# Patient Record
Sex: Male | Born: 1945
Health system: Southern US, Community
[De-identification: ages and names within clinical notes are randomized; demographics above are authoritative.]

## PROBLEM LIST (undated history)

## (undated) DIAGNOSIS — I10 Essential (primary) hypertension: Secondary | ICD-10-CM

## (undated) DIAGNOSIS — K219 Gastro-esophageal reflux disease without esophagitis: Secondary | ICD-10-CM

## (undated) DIAGNOSIS — E7800 Pure hypercholesterolemia, unspecified: Secondary | ICD-10-CM

## (undated) DIAGNOSIS — R1310 Dysphagia, unspecified: Secondary | ICD-10-CM

## (undated) HISTORY — DX: Essential (primary) hypertension: I10

## (undated) HISTORY — DX: Dysphagia, unspecified: R13.10

## (undated) HISTORY — DX: Pure hypercholesterolemia, unspecified: E78.00

## (undated) HISTORY — PX: HERNIA REPAIR: SHX51

## (undated) HISTORY — PX: ESOPHAGOGASTRODUODENOSCOPY: SHX1529

---

## 2002-01-12 ENCOUNTER — Ambulatory Visit (HOSPITAL_COMMUNITY): Admission: RE | Admit: 2002-01-12 | Discharge: 2002-01-12 | Payer: Self-pay | Admitting: Family Medicine

## 2002-01-12 ENCOUNTER — Encounter: Payer: Self-pay | Admitting: Family Medicine

## 2002-05-20 ENCOUNTER — Emergency Department (HOSPITAL_COMMUNITY): Admission: EM | Admit: 2002-05-20 | Discharge: 2002-05-21 | Payer: Self-pay | Admitting: *Deleted

## 2002-05-21 ENCOUNTER — Encounter: Payer: Self-pay | Admitting: *Deleted

## 2010-09-06 DEATH — deceased

## 2011-05-07 DEATH — deceased

## 2011-08-09 DIAGNOSIS — E291 Testicular hypofunction: Secondary | ICD-10-CM | POA: Diagnosis not present

## 2011-08-29 DIAGNOSIS — Z6825 Body mass index (BMI) 25.0-25.9, adult: Secondary | ICD-10-CM | POA: Diagnosis not present

## 2011-08-29 DIAGNOSIS — I10 Essential (primary) hypertension: Secondary | ICD-10-CM | POA: Diagnosis not present

## 2011-08-29 DIAGNOSIS — E785 Hyperlipidemia, unspecified: Secondary | ICD-10-CM | POA: Diagnosis not present

## 2011-08-29 DIAGNOSIS — G4700 Insomnia, unspecified: Secondary | ICD-10-CM | POA: Diagnosis not present

## 2011-09-03 DIAGNOSIS — E785 Hyperlipidemia, unspecified: Secondary | ICD-10-CM | POA: Diagnosis not present

## 2011-09-03 DIAGNOSIS — G4700 Insomnia, unspecified: Secondary | ICD-10-CM | POA: Diagnosis not present

## 2011-09-03 DIAGNOSIS — Z125 Encounter for screening for malignant neoplasm of prostate: Secondary | ICD-10-CM | POA: Diagnosis not present

## 2011-09-03 DIAGNOSIS — I10 Essential (primary) hypertension: Secondary | ICD-10-CM | POA: Diagnosis not present

## 2011-09-14 DIAGNOSIS — E291 Testicular hypofunction: Secondary | ICD-10-CM | POA: Diagnosis not present

## 2011-10-11 DIAGNOSIS — E291 Testicular hypofunction: Secondary | ICD-10-CM | POA: Diagnosis not present

## 2011-11-09 DIAGNOSIS — E291 Testicular hypofunction: Secondary | ICD-10-CM | POA: Diagnosis not present

## 2011-12-07 DIAGNOSIS — E291 Testicular hypofunction: Secondary | ICD-10-CM | POA: Diagnosis not present

## 2012-01-08 DIAGNOSIS — E291 Testicular hypofunction: Secondary | ICD-10-CM | POA: Diagnosis not present

## 2012-01-22 DIAGNOSIS — Z6825 Body mass index (BMI) 25.0-25.9, adult: Secondary | ICD-10-CM | POA: Diagnosis not present

## 2012-01-22 DIAGNOSIS — I10 Essential (primary) hypertension: Secondary | ICD-10-CM | POA: Diagnosis not present

## 2012-01-22 DIAGNOSIS — L259 Unspecified contact dermatitis, unspecified cause: Secondary | ICD-10-CM | POA: Diagnosis not present

## 2012-01-26 DIAGNOSIS — L02419 Cutaneous abscess of limb, unspecified: Secondary | ICD-10-CM | POA: Diagnosis not present

## 2012-01-26 DIAGNOSIS — L03119 Cellulitis of unspecified part of limb: Secondary | ICD-10-CM | POA: Diagnosis not present

## 2012-02-15 DIAGNOSIS — E291 Testicular hypofunction: Secondary | ICD-10-CM | POA: Diagnosis not present

## 2012-03-01 DIAGNOSIS — J069 Acute upper respiratory infection, unspecified: Secondary | ICD-10-CM | POA: Diagnosis not present

## 2012-03-01 DIAGNOSIS — J019 Acute sinusitis, unspecified: Secondary | ICD-10-CM | POA: Diagnosis not present

## 2012-03-12 DIAGNOSIS — E291 Testicular hypofunction: Secondary | ICD-10-CM | POA: Diagnosis not present

## 2012-03-31 DIAGNOSIS — Z6825 Body mass index (BMI) 25.0-25.9, adult: Secondary | ICD-10-CM | POA: Diagnosis not present

## 2012-03-31 DIAGNOSIS — I10 Essential (primary) hypertension: Secondary | ICD-10-CM | POA: Diagnosis not present

## 2012-03-31 DIAGNOSIS — E785 Hyperlipidemia, unspecified: Secondary | ICD-10-CM | POA: Diagnosis not present

## 2012-04-01 DIAGNOSIS — I10 Essential (primary) hypertension: Secondary | ICD-10-CM | POA: Diagnosis not present

## 2012-04-01 DIAGNOSIS — E785 Hyperlipidemia, unspecified: Secondary | ICD-10-CM | POA: Diagnosis not present

## 2012-04-11 DIAGNOSIS — E291 Testicular hypofunction: Secondary | ICD-10-CM | POA: Diagnosis not present

## 2012-04-18 DIAGNOSIS — J069 Acute upper respiratory infection, unspecified: Secondary | ICD-10-CM | POA: Diagnosis not present

## 2012-04-18 DIAGNOSIS — Z6825 Body mass index (BMI) 25.0-25.9, adult: Secondary | ICD-10-CM | POA: Diagnosis not present

## 2012-04-18 DIAGNOSIS — J019 Acute sinusitis, unspecified: Secondary | ICD-10-CM | POA: Diagnosis not present

## 2012-04-29 DIAGNOSIS — IMO0002 Reserved for concepts with insufficient information to code with codable children: Secondary | ICD-10-CM | POA: Diagnosis not present

## 2012-04-29 DIAGNOSIS — J019 Acute sinusitis, unspecified: Secondary | ICD-10-CM | POA: Diagnosis not present

## 2012-05-09 DIAGNOSIS — Z23 Encounter for immunization: Secondary | ICD-10-CM | POA: Diagnosis not present

## 2012-05-09 DIAGNOSIS — E291 Testicular hypofunction: Secondary | ICD-10-CM | POA: Diagnosis not present

## 2012-06-10 DIAGNOSIS — E291 Testicular hypofunction: Secondary | ICD-10-CM | POA: Diagnosis not present

## 2012-07-09 DIAGNOSIS — E291 Testicular hypofunction: Secondary | ICD-10-CM | POA: Diagnosis not present

## 2012-08-13 DIAGNOSIS — E291 Testicular hypofunction: Secondary | ICD-10-CM | POA: Diagnosis not present

## 2012-08-25 DIAGNOSIS — Z6825 Body mass index (BMI) 25.0-25.9, adult: Secondary | ICD-10-CM | POA: Diagnosis not present

## 2012-08-25 DIAGNOSIS — E785 Hyperlipidemia, unspecified: Secondary | ICD-10-CM | POA: Diagnosis not present

## 2012-08-25 DIAGNOSIS — E291 Testicular hypofunction: Secondary | ICD-10-CM | POA: Diagnosis not present

## 2012-08-25 DIAGNOSIS — I10 Essential (primary) hypertension: Secondary | ICD-10-CM | POA: Diagnosis not present

## 2012-09-05 DIAGNOSIS — F411 Generalized anxiety disorder: Secondary | ICD-10-CM | POA: Diagnosis not present

## 2012-09-05 DIAGNOSIS — Z125 Encounter for screening for malignant neoplasm of prostate: Secondary | ICD-10-CM | POA: Diagnosis not present

## 2012-09-05 DIAGNOSIS — I10 Essential (primary) hypertension: Secondary | ICD-10-CM | POA: Diagnosis not present

## 2012-09-05 DIAGNOSIS — E785 Hyperlipidemia, unspecified: Secondary | ICD-10-CM | POA: Diagnosis not present

## 2012-09-05 DIAGNOSIS — E291 Testicular hypofunction: Secondary | ICD-10-CM | POA: Diagnosis not present

## 2012-09-08 DIAGNOSIS — D800 Hereditary hypogammaglobulinemia: Secondary | ICD-10-CM | POA: Diagnosis not present

## 2012-10-14 DIAGNOSIS — E291 Testicular hypofunction: Secondary | ICD-10-CM | POA: Diagnosis not present

## 2012-11-17 DIAGNOSIS — E785 Hyperlipidemia, unspecified: Secondary | ICD-10-CM | POA: Diagnosis not present

## 2012-11-17 DIAGNOSIS — Z6825 Body mass index (BMI) 25.0-25.9, adult: Secondary | ICD-10-CM | POA: Diagnosis not present

## 2012-11-17 DIAGNOSIS — I10 Essential (primary) hypertension: Secondary | ICD-10-CM | POA: Diagnosis not present

## 2012-11-17 DIAGNOSIS — Z79899 Other long term (current) drug therapy: Secondary | ICD-10-CM | POA: Diagnosis not present

## 2012-11-17 DIAGNOSIS — F411 Generalized anxiety disorder: Secondary | ICD-10-CM | POA: Diagnosis not present

## 2012-12-23 DIAGNOSIS — E785 Hyperlipidemia, unspecified: Secondary | ICD-10-CM | POA: Diagnosis not present

## 2012-12-23 DIAGNOSIS — Z79899 Other long term (current) drug therapy: Secondary | ICD-10-CM | POA: Diagnosis not present

## 2012-12-23 DIAGNOSIS — E291 Testicular hypofunction: Secondary | ICD-10-CM | POA: Diagnosis not present

## 2012-12-23 DIAGNOSIS — I10 Essential (primary) hypertension: Secondary | ICD-10-CM | POA: Diagnosis not present

## 2012-12-23 DIAGNOSIS — F411 Generalized anxiety disorder: Secondary | ICD-10-CM | POA: Diagnosis not present

## 2013-01-12 DIAGNOSIS — Z6825 Body mass index (BMI) 25.0-25.9, adult: Secondary | ICD-10-CM | POA: Diagnosis not present

## 2013-01-12 DIAGNOSIS — J019 Acute sinusitis, unspecified: Secondary | ICD-10-CM | POA: Diagnosis not present

## 2013-01-12 DIAGNOSIS — J309 Allergic rhinitis, unspecified: Secondary | ICD-10-CM | POA: Diagnosis not present

## 2013-01-20 DIAGNOSIS — E291 Testicular hypofunction: Secondary | ICD-10-CM | POA: Diagnosis not present

## 2013-01-30 DIAGNOSIS — R338 Other retention of urine: Secondary | ICD-10-CM | POA: Diagnosis not present

## 2013-01-30 DIAGNOSIS — R197 Diarrhea, unspecified: Secondary | ICD-10-CM | POA: Diagnosis not present

## 2013-01-30 DIAGNOSIS — R339 Retention of urine, unspecified: Secondary | ICD-10-CM | POA: Diagnosis not present

## 2013-01-30 DIAGNOSIS — R300 Dysuria: Secondary | ICD-10-CM | POA: Diagnosis not present

## 2013-02-02 DIAGNOSIS — R339 Retention of urine, unspecified: Secondary | ICD-10-CM | POA: Diagnosis not present

## 2013-02-03 DIAGNOSIS — R339 Retention of urine, unspecified: Secondary | ICD-10-CM | POA: Diagnosis not present

## 2013-02-09 DIAGNOSIS — R339 Retention of urine, unspecified: Secondary | ICD-10-CM | POA: Diagnosis not present

## 2013-02-09 DIAGNOSIS — N401 Enlarged prostate with lower urinary tract symptoms: Secondary | ICD-10-CM | POA: Diagnosis not present

## 2013-02-16 DIAGNOSIS — N401 Enlarged prostate with lower urinary tract symptoms: Secondary | ICD-10-CM | POA: Diagnosis not present

## 2013-03-09 DIAGNOSIS — E291 Testicular hypofunction: Secondary | ICD-10-CM | POA: Diagnosis not present

## 2013-04-15 DIAGNOSIS — E291 Testicular hypofunction: Secondary | ICD-10-CM | POA: Diagnosis not present

## 2013-05-08 DIAGNOSIS — Z23 Encounter for immunization: Secondary | ICD-10-CM | POA: Diagnosis not present

## 2013-05-08 DIAGNOSIS — E291 Testicular hypofunction: Secondary | ICD-10-CM | POA: Diagnosis not present

## 2013-05-18 DIAGNOSIS — N401 Enlarged prostate with lower urinary tract symptoms: Secondary | ICD-10-CM | POA: Diagnosis not present

## 2013-06-11 DIAGNOSIS — E291 Testicular hypofunction: Secondary | ICD-10-CM | POA: Diagnosis not present

## 2013-07-07 DIAGNOSIS — E291 Testicular hypofunction: Secondary | ICD-10-CM | POA: Diagnosis not present

## 2013-07-07 DIAGNOSIS — N400 Enlarged prostate without lower urinary tract symptoms: Secondary | ICD-10-CM | POA: Diagnosis not present

## 2013-07-07 DIAGNOSIS — IMO0002 Reserved for concepts with insufficient information to code with codable children: Secondary | ICD-10-CM | POA: Diagnosis not present

## 2013-07-07 DIAGNOSIS — F528 Other sexual dysfunction not due to a substance or known physiological condition: Secondary | ICD-10-CM | POA: Diagnosis not present

## 2013-07-21 DIAGNOSIS — I10 Essential (primary) hypertension: Secondary | ICD-10-CM | POA: Diagnosis not present

## 2013-08-25 DIAGNOSIS — Z6825 Body mass index (BMI) 25.0-25.9, adult: Secondary | ICD-10-CM | POA: Diagnosis not present

## 2013-08-25 DIAGNOSIS — F528 Other sexual dysfunction not due to a substance or known physiological condition: Secondary | ICD-10-CM | POA: Diagnosis not present

## 2013-08-25 DIAGNOSIS — I10 Essential (primary) hypertension: Secondary | ICD-10-CM | POA: Diagnosis not present

## 2013-08-25 DIAGNOSIS — J019 Acute sinusitis, unspecified: Secondary | ICD-10-CM | POA: Diagnosis not present

## 2013-08-25 DIAGNOSIS — N400 Enlarged prostate without lower urinary tract symptoms: Secondary | ICD-10-CM | POA: Diagnosis not present

## 2013-09-07 DIAGNOSIS — J019 Acute sinusitis, unspecified: Secondary | ICD-10-CM | POA: Diagnosis not present

## 2013-09-07 DIAGNOSIS — I10 Essential (primary) hypertension: Secondary | ICD-10-CM | POA: Diagnosis not present

## 2013-09-07 DIAGNOSIS — F528 Other sexual dysfunction not due to a substance or known physiological condition: Secondary | ICD-10-CM | POA: Diagnosis not present

## 2013-09-07 DIAGNOSIS — N400 Enlarged prostate without lower urinary tract symptoms: Secondary | ICD-10-CM | POA: Diagnosis not present

## 2013-09-28 DIAGNOSIS — IMO0002 Reserved for concepts with insufficient information to code with codable children: Secondary | ICD-10-CM | POA: Diagnosis not present

## 2013-09-28 DIAGNOSIS — I10 Essential (primary) hypertension: Secondary | ICD-10-CM | POA: Diagnosis not present

## 2013-09-28 DIAGNOSIS — J019 Acute sinusitis, unspecified: Secondary | ICD-10-CM | POA: Diagnosis not present

## 2013-10-03 DIAGNOSIS — IMO0002 Reserved for concepts with insufficient information to code with codable children: Secondary | ICD-10-CM | POA: Diagnosis not present

## 2013-10-03 DIAGNOSIS — R059 Cough, unspecified: Secondary | ICD-10-CM | POA: Diagnosis not present

## 2013-10-03 DIAGNOSIS — R05 Cough: Secondary | ICD-10-CM | POA: Diagnosis not present

## 2013-10-30 DIAGNOSIS — IMO0002 Reserved for concepts with insufficient information to code with codable children: Secondary | ICD-10-CM | POA: Diagnosis not present

## 2013-10-30 DIAGNOSIS — K1370 Unspecified lesions of oral mucosa: Secondary | ICD-10-CM | POA: Diagnosis not present

## 2014-02-22 DIAGNOSIS — N139 Obstructive and reflux uropathy, unspecified: Secondary | ICD-10-CM | POA: Diagnosis not present

## 2014-02-22 DIAGNOSIS — N401 Enlarged prostate with lower urinary tract symptoms: Secondary | ICD-10-CM | POA: Diagnosis not present

## 2014-05-07 DIAGNOSIS — Z23 Encounter for immunization: Secondary | ICD-10-CM | POA: Diagnosis not present

## 2014-08-20 DIAGNOSIS — Z23 Encounter for immunization: Secondary | ICD-10-CM | POA: Diagnosis not present

## 2014-08-26 DIAGNOSIS — E782 Mixed hyperlipidemia: Secondary | ICD-10-CM | POA: Diagnosis not present

## 2014-08-26 DIAGNOSIS — Z6825 Body mass index (BMI) 25.0-25.9, adult: Secondary | ICD-10-CM | POA: Diagnosis not present

## 2014-08-26 DIAGNOSIS — E663 Overweight: Secondary | ICD-10-CM | POA: Diagnosis not present

## 2014-08-26 DIAGNOSIS — R7301 Impaired fasting glucose: Secondary | ICD-10-CM | POA: Diagnosis not present

## 2014-08-26 DIAGNOSIS — I10 Essential (primary) hypertension: Secondary | ICD-10-CM | POA: Diagnosis not present

## 2014-08-26 DIAGNOSIS — Z125 Encounter for screening for malignant neoplasm of prostate: Secondary | ICD-10-CM | POA: Diagnosis not present

## 2014-10-15 DIAGNOSIS — R945 Abnormal results of liver function studies: Secondary | ICD-10-CM | POA: Diagnosis not present

## 2014-10-15 DIAGNOSIS — K7689 Other specified diseases of liver: Secondary | ICD-10-CM | POA: Diagnosis not present

## 2014-10-19 ENCOUNTER — Other Ambulatory Visit (HOSPITAL_COMMUNITY): Payer: Self-pay | Admitting: Family Medicine

## 2014-10-19 DIAGNOSIS — K7689 Other specified diseases of liver: Secondary | ICD-10-CM

## 2014-10-21 ENCOUNTER — Ambulatory Visit (HOSPITAL_COMMUNITY): Admission: RE | Admit: 2014-10-21 | Payer: BLUE CROSS/BLUE SHIELD | Source: Ambulatory Visit

## 2014-10-25 DIAGNOSIS — Z6825 Body mass index (BMI) 25.0-25.9, adult: Secondary | ICD-10-CM | POA: Diagnosis not present

## 2014-10-25 DIAGNOSIS — R945 Abnormal results of liver function studies: Secondary | ICD-10-CM | POA: Diagnosis not present

## 2014-10-27 ENCOUNTER — Ambulatory Visit (HOSPITAL_COMMUNITY): Payer: Medicare Other

## 2014-10-29 ENCOUNTER — Ambulatory Visit (HOSPITAL_COMMUNITY)
Admission: RE | Admit: 2014-10-29 | Discharge: 2014-10-29 | Disposition: A | Discharge status: Home / Self Care | Payer: Medicare Other | Source: Ambulatory Visit | Attending: Family Medicine | Admitting: Family Medicine

## 2014-10-29 DIAGNOSIS — K7689 Other specified diseases of liver: Secondary | ICD-10-CM | POA: Insufficient documentation

## 2014-10-29 DIAGNOSIS — R945 Abnormal results of liver function studies: Secondary | ICD-10-CM | POA: Diagnosis not present

## 2014-11-01 DIAGNOSIS — Z6825 Body mass index (BMI) 25.0-25.9, adult: Secondary | ICD-10-CM | POA: Diagnosis not present

## 2014-11-01 DIAGNOSIS — N401 Enlarged prostate with lower urinary tract symptoms: Secondary | ICD-10-CM | POA: Diagnosis not present

## 2014-11-01 DIAGNOSIS — J069 Acute upper respiratory infection, unspecified: Secondary | ICD-10-CM | POA: Diagnosis not present

## 2014-11-01 DIAGNOSIS — E663 Overweight: Secondary | ICD-10-CM | POA: Diagnosis not present

## 2014-11-16 DIAGNOSIS — R945 Abnormal results of liver function studies: Secondary | ICD-10-CM | POA: Diagnosis not present

## 2014-11-16 DIAGNOSIS — K838 Other specified diseases of biliary tract: Secondary | ICD-10-CM | POA: Diagnosis not present

## 2014-11-16 DIAGNOSIS — Z6823 Body mass index (BMI) 23.0-23.9, adult: Secondary | ICD-10-CM | POA: Diagnosis not present

## 2015-02-23 DIAGNOSIS — I10 Essential (primary) hypertension: Secondary | ICD-10-CM | POA: Diagnosis not present

## 2015-02-23 DIAGNOSIS — R972 Elevated prostate specific antigen [PSA]: Secondary | ICD-10-CM | POA: Diagnosis not present

## 2015-02-23 DIAGNOSIS — E782 Mixed hyperlipidemia: Secondary | ICD-10-CM | POA: Diagnosis not present

## 2015-02-23 DIAGNOSIS — Z1389 Encounter for screening for other disorder: Secondary | ICD-10-CM | POA: Diagnosis not present

## 2015-02-23 DIAGNOSIS — R7989 Other specified abnormal findings of blood chemistry: Secondary | ICD-10-CM | POA: Diagnosis not present

## 2015-02-23 DIAGNOSIS — R7309 Other abnormal glucose: Secondary | ICD-10-CM | POA: Diagnosis not present

## 2015-02-23 DIAGNOSIS — Z6823 Body mass index (BMI) 23.0-23.9, adult: Secondary | ICD-10-CM | POA: Diagnosis not present

## 2015-03-01 DIAGNOSIS — Z1389 Encounter for screening for other disorder: Secondary | ICD-10-CM | POA: Diagnosis not present

## 2015-03-01 DIAGNOSIS — Z6823 Body mass index (BMI) 23.0-23.9, adult: Secondary | ICD-10-CM | POA: Diagnosis not present

## 2015-03-01 DIAGNOSIS — J069 Acute upper respiratory infection, unspecified: Secondary | ICD-10-CM | POA: Diagnosis not present

## 2015-03-29 DIAGNOSIS — N138 Other obstructive and reflux uropathy: Secondary | ICD-10-CM | POA: Diagnosis not present

## 2015-03-29 DIAGNOSIS — N401 Enlarged prostate with lower urinary tract symptoms: Secondary | ICD-10-CM | POA: Diagnosis not present

## 2015-05-16 DIAGNOSIS — Z6822 Body mass index (BMI) 22.0-22.9, adult: Secondary | ICD-10-CM | POA: Diagnosis not present

## 2015-05-16 DIAGNOSIS — Z23 Encounter for immunization: Secondary | ICD-10-CM | POA: Diagnosis not present

## 2015-05-16 DIAGNOSIS — K838 Other specified diseases of biliary tract: Secondary | ICD-10-CM | POA: Diagnosis not present

## 2015-05-16 DIAGNOSIS — R945 Abnormal results of liver function studies: Secondary | ICD-10-CM | POA: Diagnosis not present

## 2015-05-16 DIAGNOSIS — I10 Essential (primary) hypertension: Secondary | ICD-10-CM | POA: Diagnosis not present

## 2015-05-16 DIAGNOSIS — E782 Mixed hyperlipidemia: Secondary | ICD-10-CM | POA: Diagnosis not present

## 2015-05-16 DIAGNOSIS — Z1389 Encounter for screening for other disorder: Secondary | ICD-10-CM | POA: Diagnosis not present

## 2015-05-16 DIAGNOSIS — R7309 Other abnormal glucose: Secondary | ICD-10-CM | POA: Diagnosis not present

## 2015-08-17 DIAGNOSIS — E782 Mixed hyperlipidemia: Secondary | ICD-10-CM | POA: Diagnosis not present

## 2015-08-17 DIAGNOSIS — I10 Essential (primary) hypertension: Secondary | ICD-10-CM | POA: Diagnosis not present

## 2015-08-17 DIAGNOSIS — Z23 Encounter for immunization: Secondary | ICD-10-CM | POA: Diagnosis not present

## 2015-08-17 DIAGNOSIS — Z125 Encounter for screening for malignant neoplasm of prostate: Secondary | ICD-10-CM | POA: Diagnosis not present

## 2015-08-17 DIAGNOSIS — Z1389 Encounter for screening for other disorder: Secondary | ICD-10-CM | POA: Diagnosis not present

## 2015-08-22 DIAGNOSIS — R7309 Other abnormal glucose: Secondary | ICD-10-CM | POA: Diagnosis not present

## 2015-09-06 DIAGNOSIS — I10 Essential (primary) hypertension: Secondary | ICD-10-CM | POA: Diagnosis not present

## 2015-09-06 DIAGNOSIS — Z6823 Body mass index (BMI) 23.0-23.9, adult: Secondary | ICD-10-CM | POA: Diagnosis not present

## 2015-09-06 DIAGNOSIS — Z1389 Encounter for screening for other disorder: Secondary | ICD-10-CM | POA: Diagnosis not present

## 2015-09-06 DIAGNOSIS — Z23 Encounter for immunization: Secondary | ICD-10-CM | POA: Diagnosis not present

## 2015-09-06 DIAGNOSIS — E782 Mixed hyperlipidemia: Secondary | ICD-10-CM | POA: Diagnosis not present

## 2015-09-06 DIAGNOSIS — R7309 Other abnormal glucose: Secondary | ICD-10-CM | POA: Diagnosis not present

## 2015-10-07 DIAGNOSIS — Z23 Encounter for immunization: Secondary | ICD-10-CM | POA: Diagnosis not present

## 2015-10-07 DIAGNOSIS — D485 Neoplasm of uncertain behavior of skin: Secondary | ICD-10-CM | POA: Diagnosis not present

## 2015-10-07 DIAGNOSIS — L821 Other seborrheic keratosis: Secondary | ICD-10-CM | POA: Diagnosis not present

## 2015-10-07 DIAGNOSIS — D045 Carcinoma in situ of skin of trunk: Secondary | ICD-10-CM | POA: Diagnosis not present

## 2015-10-07 DIAGNOSIS — L570 Actinic keratosis: Secondary | ICD-10-CM | POA: Diagnosis not present

## 2015-10-10 ENCOUNTER — Other Ambulatory Visit (HOSPITAL_COMMUNITY): Payer: Self-pay | Admitting: Surgery

## 2015-10-10 DIAGNOSIS — K828 Other specified diseases of gallbladder: Secondary | ICD-10-CM | POA: Diagnosis not present

## 2015-10-20 ENCOUNTER — Ambulatory Visit (HOSPITAL_COMMUNITY)
Admission: RE | Admit: 2015-10-20 | Discharge: 2015-10-20 | Disposition: A | Discharge status: Home / Self Care | Payer: Medicare Other | Source: Ambulatory Visit | Attending: Surgery | Admitting: Surgery

## 2015-10-20 DIAGNOSIS — K829 Disease of gallbladder, unspecified: Secondary | ICD-10-CM | POA: Diagnosis not present

## 2015-10-20 DIAGNOSIS — K828 Other specified diseases of gallbladder: Secondary | ICD-10-CM | POA: Diagnosis not present

## 2015-10-20 MED ORDER — TECHNETIUM TC 99M MEBROFENIN IV KIT
5.2000 | PACK | Freq: Once | INTRAVENOUS | Status: AC | PRN
  Administered 2015-10-20: 5 via INTRAVENOUS

## 2015-11-08 DIAGNOSIS — D045 Carcinoma in situ of skin of trunk: Secondary | ICD-10-CM | POA: Diagnosis not present

## 2015-11-30 DIAGNOSIS — Z6823 Body mass index (BMI) 23.0-23.9, adult: Secondary | ICD-10-CM | POA: Diagnosis not present

## 2015-11-30 DIAGNOSIS — Z0000 Encounter for general adult medical examination without abnormal findings: Secondary | ICD-10-CM | POA: Diagnosis not present

## 2015-11-30 DIAGNOSIS — Z1389 Encounter for screening for other disorder: Secondary | ICD-10-CM | POA: Diagnosis not present

## 2016-03-30 DIAGNOSIS — N400 Enlarged prostate without lower urinary tract symptoms: Secondary | ICD-10-CM | POA: Diagnosis not present

## 2016-04-27 IMAGING — NM NM HEPATO W/GB/PHARM/[PERSON_NAME]
2 series · 12 of 12 positions shown · non-contrast
Comparison: Abdominal ultrasound October 29, 2014

CLINICAL DATA: Ultrasound showing sludge in gallbladder

EXAM:
NUCLEAR MEDICINE HEPATOBILIARY IMAGING WITH GALLBLADDER EF
Views: Anterior right upper quadrant
RADIOPHARMACEUTICALS:  5.2 mCi Cc-YYm  Choletec IV

[Series 1: biliary · 4.14mm/px · 6 of 60 frames shown]
[frame 6/60]
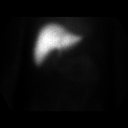
[frame 16/60]
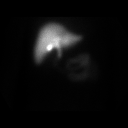
[frame 26/60]
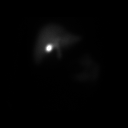
[frame 36/60]
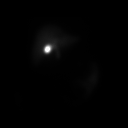
[frame 46/60]
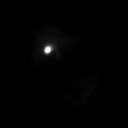
[frame 56/60]
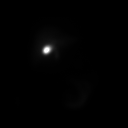

[Series 3: gbef · 4.14mm/px · 6 of 60 frames shown]
[frame 6/60]
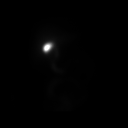
[frame 16/60]
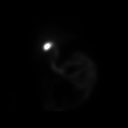
[frame 26/60]
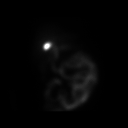
[frame 36/60]
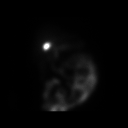
[frame 46/60]
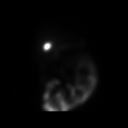
[frame 56/60]
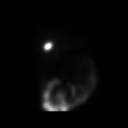

[12 of 12 positions shown; findings below may reference images not displayed]

FINDINGS: Liver uptake of radiotracer is normal. There is prompt visualization
of gallbladder and small bowel, indicating patency of the cystic and
common bile ducts. The patient consumed 8 ounces of Ensure orally
with calculation of the computer generated ejection fraction of
radiotracer from the gallbladder. The patient did not experience
clinical symptoms with the oral Ensure consumption. The computer
generated ejection fraction of radiotracer from the gallbladder is
normal at 74%, normal greater than 33% using the oral agent.
IMPRESSION: Study within normal limits.

## 2016-05-24 DIAGNOSIS — L570 Actinic keratosis: Secondary | ICD-10-CM | POA: Diagnosis not present

## 2016-05-24 DIAGNOSIS — Z85828 Personal history of other malignant neoplasm of skin: Secondary | ICD-10-CM | POA: Diagnosis not present

## 2016-05-24 DIAGNOSIS — L821 Other seborrheic keratosis: Secondary | ICD-10-CM | POA: Diagnosis not present

## 2016-05-24 DIAGNOSIS — Z23 Encounter for immunization: Secondary | ICD-10-CM | POA: Diagnosis not present

## 2016-06-01 DIAGNOSIS — I10 Essential (primary) hypertension: Secondary | ICD-10-CM | POA: Diagnosis not present

## 2016-06-01 DIAGNOSIS — Z6823 Body mass index (BMI) 23.0-23.9, adult: Secondary | ICD-10-CM | POA: Diagnosis not present

## 2016-06-01 DIAGNOSIS — R7309 Other abnormal glucose: Secondary | ICD-10-CM | POA: Diagnosis not present

## 2016-06-01 DIAGNOSIS — F419 Anxiety disorder, unspecified: Secondary | ICD-10-CM | POA: Diagnosis not present

## 2016-06-01 DIAGNOSIS — E782 Mixed hyperlipidemia: Secondary | ICD-10-CM | POA: Diagnosis not present

## 2016-06-01 DIAGNOSIS — R945 Abnormal results of liver function studies: Secondary | ICD-10-CM | POA: Diagnosis not present

## 2016-06-01 DIAGNOSIS — Z1389 Encounter for screening for other disorder: Secondary | ICD-10-CM | POA: Diagnosis not present

## 2016-07-20 DIAGNOSIS — B029 Zoster without complications: Secondary | ICD-10-CM | POA: Diagnosis not present

## 2016-07-20 DIAGNOSIS — Z6824 Body mass index (BMI) 24.0-24.9, adult: Secondary | ICD-10-CM | POA: Diagnosis not present

## 2016-08-30 ENCOUNTER — Encounter (INDEPENDENT_AMBULATORY_CARE_PROVIDER_SITE_OTHER): Payer: Self-pay | Admitting: Internal Medicine

## 2016-08-30 DIAGNOSIS — Z1389 Encounter for screening for other disorder: Secondary | ICD-10-CM | POA: Diagnosis not present

## 2016-08-30 DIAGNOSIS — K219 Gastro-esophageal reflux disease without esophagitis: Secondary | ICD-10-CM | POA: Diagnosis not present

## 2016-08-30 DIAGNOSIS — R1310 Dysphagia, unspecified: Secondary | ICD-10-CM | POA: Diagnosis not present

## 2016-08-30 DIAGNOSIS — Z6824 Body mass index (BMI) 24.0-24.9, adult: Secondary | ICD-10-CM | POA: Diagnosis not present

## 2016-09-03 ENCOUNTER — Encounter (INDEPENDENT_AMBULATORY_CARE_PROVIDER_SITE_OTHER): Payer: Self-pay | Admitting: *Deleted

## 2016-09-03 ENCOUNTER — Ambulatory Visit (INDEPENDENT_AMBULATORY_CARE_PROVIDER_SITE_OTHER): Payer: Medicare Other | Admitting: Internal Medicine

## 2016-09-03 ENCOUNTER — Encounter (INDEPENDENT_AMBULATORY_CARE_PROVIDER_SITE_OTHER): Payer: Self-pay | Admitting: Internal Medicine

## 2016-09-03 ENCOUNTER — Other Ambulatory Visit (INDEPENDENT_AMBULATORY_CARE_PROVIDER_SITE_OTHER): Payer: Self-pay | Admitting: Internal Medicine

## 2016-09-03 ENCOUNTER — Encounter (INDEPENDENT_AMBULATORY_CARE_PROVIDER_SITE_OTHER): Payer: Self-pay

## 2016-09-03 DIAGNOSIS — R1310 Dysphagia, unspecified: Secondary | ICD-10-CM | POA: Insufficient documentation

## 2016-09-03 DIAGNOSIS — R1319 Other dysphagia: Secondary | ICD-10-CM | POA: Insufficient documentation

## 2016-09-03 DIAGNOSIS — I10 Essential (primary) hypertension: Secondary | ICD-10-CM

## 2016-09-03 DIAGNOSIS — E7800 Pure hypercholesterolemia, unspecified: Secondary | ICD-10-CM | POA: Diagnosis not present

## 2016-09-03 NOTE — Progress Notes (Signed)
   Subjective:    Patient ID: Ethan Seat., male    DOB: 1945-10-09, 71 y.o.   MRN: [id number]  HPI Referred by Dr. Hilma Favors for dysphagia. Has been on Prilosec for about 3 weeks. Had been taking Tum for acid reflux.  Dysphagia for about a week. Last episode one week ago. Had to lean over a chair to dislodge the bolus (Bread). His appetite is good. No weight loss.  Usually has a BM daily. No melena or BRRB. Acid reflux controlled with Prilosec. Usually has a BM daily.     Past hx of dysphagia and has undergone EGD/ED in the past.  Review of Systems No past medical history on file.  No past surgical history on file.  No Known Allergies  No current outpatient prescriptions on file prior to visit.   No current facility-administered medications on file prior to visit.    Current Outpatient Prescriptions  Medication Sig Dispense Refill  . ALPRAZolam (XANAX) 1 MG tablet Take 1 mg by mouth at bedtime as needed for anxiety. 1/2 tab at night    . aspirin EC 81 MG tablet Take 81 mg by mouth daily.    . cholecalciferol (VITAMIN D) 400 units TABS tablet Take 2,000 Units by mouth.    . losartan-hydrochlorothiazide (HYZAAR) 50-12.5 MG tablet Take 1 tablet by mouth daily.    . Multiple Vitamins-Minerals (CENTRUM SILVER 50+MEN PO) Take by mouth.    . Omega-3 Fatty Acids (FISH OIL) 1200 MG CAPS Take by mouth.    Marland Kitchen omeprazole (PRILOSEC) 20 MG capsule Take 20 mg by mouth daily.    . Saw Palmetto 450 MG CAPS Take by mouth 2 (two) times daily.    . simvastatin (ZOCOR) 10 MG tablet Take 10 mg by mouth daily.    . tadalafil (CIALIS) 5 MG tablet Take 5 mg by mouth daily as needed for erectile dysfunction. 1/2 tab every other night    . tamsulosin (FLOMAX) 0.4 MG CAPS capsule Take 0.4 mg by mouth.     No current facility-administered medications for this visit.        Objective:   Physical Exam Blood pressure 140/70, pulse 64, temperature 97.5 F (36.4 C), height 5\' 10"  (1.778 m), weight 171  lb 6.4 oz (77.7 kg).  Alert and oriented. Skin warm and dry. Oral mucosa is moist.   . Sclera anicteric, conjunctivae is pink. Thyroid not enlarged. No cervical lymphadenopathy. Lungs clear. Heart regular rate and rhythm.  Abdomen is soft. Bowel sounds are positive. No hepatomegaly. No abdominal masses felt. No tenderness.  No edema to lower extremities. Patient is alert and oriented.      Assessment & Plan:  Dysphagia. EGD/ED. Stricture, Web needs to be ruled out.

## 2016-09-03 NOTE — Patient Instructions (Signed)
EGD/ED. The risks and benefits such as perforation, bleeding, and infection were reviewed with the patient and is agreeable. 

## 2016-09-07 ENCOUNTER — Encounter (HOSPITAL_COMMUNITY)
Admission: RE | Disposition: A | Discharge status: Home / Self Care | Payer: Self-pay | Source: Ambulatory Visit | Attending: Internal Medicine

## 2016-09-07 ENCOUNTER — Encounter (HOSPITAL_COMMUNITY): Payer: Self-pay | Admitting: *Deleted

## 2016-09-07 ENCOUNTER — Ambulatory Visit (HOSPITAL_COMMUNITY)
Admission: RE | Admit: 2016-09-07 | Discharge: 2016-09-07 | Disposition: A | Discharge status: Home / Self Care | Payer: Medicare Other | Source: Ambulatory Visit | Attending: Internal Medicine | Admitting: Internal Medicine

## 2016-09-07 DIAGNOSIS — E7800 Pure hypercholesterolemia, unspecified: Secondary | ICD-10-CM | POA: Diagnosis not present

## 2016-09-07 DIAGNOSIS — K222 Esophageal obstruction: Secondary | ICD-10-CM | POA: Diagnosis not present

## 2016-09-07 DIAGNOSIS — K219 Gastro-esophageal reflux disease without esophagitis: Secondary | ICD-10-CM | POA: Diagnosis not present

## 2016-09-07 DIAGNOSIS — K449 Diaphragmatic hernia without obstruction or gangrene: Secondary | ICD-10-CM | POA: Insufficient documentation

## 2016-09-07 DIAGNOSIS — Z7982 Long term (current) use of aspirin: Secondary | ICD-10-CM | POA: Diagnosis not present

## 2016-09-07 DIAGNOSIS — I10 Essential (primary) hypertension: Secondary | ICD-10-CM | POA: Diagnosis not present

## 2016-09-07 DIAGNOSIS — R1310 Dysphagia, unspecified: Secondary | ICD-10-CM

## 2016-09-07 DIAGNOSIS — R1319 Other dysphagia: Secondary | ICD-10-CM | POA: Insufficient documentation

## 2016-09-07 DIAGNOSIS — K2950 Unspecified chronic gastritis without bleeding: Secondary | ICD-10-CM | POA: Diagnosis not present

## 2016-09-07 DIAGNOSIS — Z79899 Other long term (current) drug therapy: Secondary | ICD-10-CM | POA: Insufficient documentation

## 2016-09-07 DIAGNOSIS — R1314 Dysphagia, pharyngoesophageal phase: Secondary | ICD-10-CM | POA: Diagnosis not present

## 2016-09-07 DIAGNOSIS — K2970 Gastritis, unspecified, without bleeding: Secondary | ICD-10-CM | POA: Diagnosis not present

## 2016-09-07 HISTORY — PX: ESOPHAGEAL DILATION: SHX303

## 2016-09-07 HISTORY — PX: ESOPHAGOGASTRODUODENOSCOPY: SHX5428

## 2016-09-07 HISTORY — DX: Gastro-esophageal reflux disease without esophagitis: K21.9

## 2016-09-07 SURGERY — EGD (ESOPHAGOGASTRODUODENOSCOPY)
Anesthesia: Moderate Sedation

## 2016-09-07 MED ORDER — MIDAZOLAM HCL 5 MG/5ML IJ SOLN
INTRAMUSCULAR | Status: AC
  Filled 2016-09-07: qty 10

## 2016-09-07 MED ORDER — MIDAZOLAM HCL 5 MG/5ML IJ SOLN
INTRAMUSCULAR | Status: DC | PRN
  Administered 2016-09-07 (×2): 1 mg via INTRAVENOUS
  Administered 2016-09-07 (×2): 2 mg via INTRAVENOUS
  Administered 2016-09-07: 3 mg via INTRAVENOUS

## 2016-09-07 MED ORDER — BUTAMBEN-TETRACAINE-BENZOCAINE 2-2-14 % EX AERO
INHALATION_SPRAY | CUTANEOUS | Status: DC | PRN
  Administered 2016-09-07: 2 via TOPICAL

## 2016-09-07 MED ORDER — OMEPRAZOLE 20 MG PO CPDR: 20.0000 mg | DELAYED_RELEASE_CAPSULE | Freq: Every day | ORAL | Status: DC

## 2016-09-07 MED ORDER — SODIUM CHLORIDE 0.9 % IV SOLN
INTRAVENOUS | Status: DC
  Administered 2016-09-07: 10:00:00 via INTRAVENOUS

## 2016-09-07 MED ORDER — MEPERIDINE HCL 50 MG/ML IJ SOLN
INTRAMUSCULAR | Status: DC | PRN
  Administered 2016-09-07 (×2): 25 mg via INTRAVENOUS

## 2016-09-07 MED ORDER — MEPERIDINE HCL 50 MG/ML IJ SOLN
INTRAMUSCULAR | Status: AC
  Filled 2016-09-07: qty 1

## 2016-09-07 MED ORDER — SIMETHICONE 40 MG/0.6ML PO SUSP
ORAL | Status: DC | PRN
  Administered 2016-09-07: 2.5 mL

## 2016-09-07 NOTE — H&P (Signed)
Ethan Kubacki. is an 71 y.o. male.   Chief Complaint: Patient is here for EGD and ED. HPI: Patient is 71 year old Caucasian male was chronic GERD and now presents with few week history of solid food dysphagia. He had one episode of food impaction that he had to regurgitate in order to get relief. Other times food bolus passes down. He has no difficulty with liquids. He decided to come off the Prilosec because of concern for side effects and had been using OTC medications. Dr. Hilma Favors advised to go back to Prilosec he is taking it 3-4 times a week. Last EGD and dilation was 2001.  Past Medical History:  Diagnosis Date   Hiatal hernia   . GERD (gastroesophageal reflux disease)   . High cholesterol   . Hypertension     Past Surgical History:  Procedure Laterality Date  . ESOPHAGOGASTRODUODENOSCOPY     2001 or 2002 for dysphagia  . HERNIA REPAIR      History reviewed. No pertinent family history. Social History:  reports that he has never smoked. He has never used smokeless tobacco. He reports that he does not drink alcohol or use drugs.  Allergies: No Known Allergies  Medications Prior to Admission  Medication Sig Dispense Refill  . ALPRAZolam (XANAX) 1 MG tablet Take 0.5 mg by mouth at bedtime as needed for sleep. 1/2 tab at night    . aspirin EC 81 MG tablet Take 81 mg by mouth daily.    . Ergocalciferol (VITAMIN D2) 2000 units TABS Take 2,000 mg by mouth daily.    Marland Kitchen losartan-hydrochlorothiazide (HYZAAR) 50-12.5 MG tablet Take 1 tablet by mouth daily.    . Multiple Vitamins-Minerals (CENTRUM SILVER 50+MEN PO) Take 1 tablet by mouth daily.     . Omega-3 Fatty Acids (FISH OIL) 1200 MG CAPS Take 1,200 mg by mouth daily.     Marland Kitchen omeprazole (PRILOSEC) 20 MG capsule Take 20 mg by mouth daily as needed.     . Saw Palmetto 450 MG CAPS Take 450 mg by mouth daily.     . simvastatin (ZOCOR) 10 MG tablet Take 10 mg by mouth daily.    . tamsulosin (FLOMAX) 0.4 MG CAPS capsule Take 0.4 mg by  mouth daily after supper.     . tadalafil (CIALIS) 5 MG tablet Take 5 mg by mouth daily as needed for erectile dysfunction.       No results found for this or any previous visit (from the past 48 hour(s)). No results found.  ROS  Blood pressure (!) 156/81, pulse 85, temperature 97.6 F (36.4 C), temperature source Oral, resp. rate 12, SpO2 99 %. Physical Exam  Constitutional: He appears well-developed and well-nourished.  HENT:  Mouth/Throat: Oropharynx is clear and moist.  Eyes: Conjunctivae are normal. No scleral icterus.  Neck: No thyromegaly present.  Cardiovascular: Normal rate, regular rhythm and normal heart sounds.   No murmur heard. Respiratory: Effort normal and breath sounds normal.  GI: Soft. He exhibits no distension and no mass. There is no tenderness.  Musculoskeletal: He exhibits no edema.  Lymphadenopathy:    He has no cervical adenopathy.  Neurological: He is alert.  Skin: Skin is warm and dry.     Assessment/Plan Solid food dysphagia. Chronic GERD. EGD with ED.  Hildred Laser, MD 09/07/2016, 11:26 AM

## 2016-09-07 NOTE — Op Note (Signed)
[facility] Patient Name: Ethan Gardner Procedure Date: 09/07/2016 11:03 AM MRN: [id number] Date of Birth: 07/22/1946 Attending MD: Hildred Laser , MD CSN: [id number] Age: 71 Admit Type: Outpatient Procedure:                Upper GI endoscopy Indications:              Esophageal dysphagia, Gastro-esophageal reflux                            disease Providers:                Hildred Laser, MD, Lurline Del, RN, Purcell Nails. Cedar,                            Technician Referring MD:             Halford Chessman, MD Medicines:                Cetacaine spray, Meperidine 50 mg IV, Midazolam 9                            mg IV Complications:            No immediate complications. Estimated Blood Loss:     Estimated blood loss was minimal. Procedure:                Pre-Anesthesia Assessment:                           - Prior to the procedure, a History and Physical                            was performed, and patient medications and                            allergies were reviewed. The patient's tolerance of                            previous anesthesia was also reviewed. The risks                            and benefits of the procedure and the sedation                            options and risks were discussed with the patient.                            All questions were answered, and informed consent                            was obtained. Prior Anticoagulants: The patient                            last took aspirin 3 days prior to the procedure.  ASA Grade Assessment: II - A patient with mild                            systemic disease. After reviewing the risks and                            benefits, the patient was deemed in satisfactory                            condition to undergo the procedure.                           After obtaining informed consent, the endoscope was                            passed under direct vision. Throughout the                   procedure, the patient's blood pressure, pulse, and                            oxygen saturations were monitored continuously. The                            [id number]) scope was introduced through the                            mouth, and advanced to the second part of duodenum.                            The upper GI endoscopy was accomplished without                            difficulty. The patient tolerated the procedure                            well. Scope In: 11:39:48 AM Scope Out: 11:50:48 AM Total Procedure Duration: 0 hours 11 minutes 0 seconds  Findings:      The examined esophagus was normal.      The Z-line was regular and was found 39 cm from the incisors with focal       erythema.      A low-grade of narrowing Schatzki ring (acquired) was found at the       gastroesophageal junction. The scope was withdrawn. Dilation was       performed with a Maloney dilator with no resistance at 34 Fr. The       dilation site was examined following endoscope reinsertion and showed       complete resolution of luminal narrowing.      A 2 cm hiatal hernia was present.      Diffuse mild inflammation characterized by congestion (edema), erythema       and granularity was found in the gastric antrum. Biopsies were taken       with a cold forceps for histology.      The exam of the stomach was otherwise normal.      The  duodenal bulb and second portion of the duodenum were normal. Impression:               - Normal esophagus.                           - Z-line regular, 39 cm from the incisors with                            focal changes of esophagitis.                           - Low-grade of narrowing Schatzki ring. Dilated.                           - 2 cm hiatal hernia.                           - Gastritis. Biopsied.                           - Normal duodenal bulb and second portion of the                            duodenum. Moderate Sedation:       Moderate (conscious) sedation was administered by the endoscopy nurse       and supervised by the endoscopist. The following parameters were       monitored: oxygen saturation, heart rate, blood pressure, CO2       capnography and response to care. Total physician intraservice time was       17 minutes. Recommendation:           - Patient has a contact number available for                            emergencies. The signs and symptoms of potential                            delayed complications were discussed with the                            patient. Return to normal activities tomorrow.                            Written discharge instructions were provided to the                            patient.                           - Resume previous diet today.                           - Continue present medications.                           - No aspirin, ibuprofen, naproxen, or other  non-steroidal anti-inflammatory drugs for 1 day.                           - Await pathology results. Procedure Code(s):        --- Professional ---                           [phone], Esophagogastroduodenoscopy, flexible,                            transoral; with biopsy, single or multiple                           43450, Dilation of esophagus, by unguided sound or                            bougie, single or multiple passes                           99152, Moderate sedation services provided by the                            same physician or other qualified health care                            professional performing the diagnostic or                            therapeutic service that the sedation supports,                            requiring the presence of an independent trained                            observer to assist in the monitoring of the                            patient's level of consciousness and physiological                            status; initial 15  minutes of intraservice time,                            patient age 21 years or older Diagnosis Code(s):        --- Professional ---                           K22.2, Esophageal obstruction                           K44.9, Diaphragmatic hernia without obstruction or                            gangrene  K29.70, Gastritis, unspecified, without bleeding                           R13.14, Dysphagia, pharyngoesophageal phase                           K21.9, Gastro-esophageal reflux disease without                            esophagitis CPT copyright 2016 American Medical Association. All rights reserved. The codes documented in this report are preliminary and upon coder review may  be revised to meet current compliance requirements. Hildred Laser, MD Hildred Laser, MD 09/07/2016 12:02:42 PM This report has been signed electronically. Number of Addenda: 0

## 2016-09-07 NOTE — Discharge Instructions (Signed)
Take omeprazole 20 mg by mouth 30 minutes before breakfast daily for at least 12 weeks and thereafter every other day. Resume aspirin on 09/08/2016. Resume usual diet. No driving for 24 hours. Physician will call with biopsy results.   Esophagogastroduodenoscopy, Care After Introduction Refer to this sheet in the next few weeks. These instructions provide you with information about caring for yourself after your procedure. Your health care provider may also give you more specific instructions. Your treatment has been planned according to current medical practices, but problems sometimes occur. Call your health care provider if you have any problems or questions after your procedure. What can I expect after the procedure? After the procedure, it is common to have:  A sore throat.  Nausea.  Bloating.  Dizziness.  Fatigue. Follow these instructions at home:  Do not eat or drink anything until the numbing medicine (local anesthetic) has worn off and your gag reflex has returned. You will know that the local anesthetic has worn off when you can swallow comfortably.  Do not drive for 24 hours if you received a medicine to help you relax (sedative).  If your health care provider took a tissue sample for testing during the procedure, make sure to get your test results. This is your responsibility. Ask your health care provider or the department performing the test when your results will be ready.  Keep all follow-up visits as told by your health care provider. This is important. Contact a health care provider if:  You cannot stop coughing.  You are not urinating.  You are urinating less than usual. Get help right away if:  You have trouble swallowing.  You cannot eat or drink.  You have throat or chest pain that gets worse.  You are dizzy or light-headed.  You faint.  You have nausea or vomiting.  You have chills.  You have a fever.  You have severe abdominal  pain.  You have black, tarry, or bloody stools. This information is not intended to replace advice given to you by your health care provider. Make sure you discuss any questions you have with your health care provider. Document Released: 07/09/2012 Document Revised: 12/29/2015 Document Reviewed: 06/16/2015  2017 Elsevier

## 2016-09-10 ENCOUNTER — Ambulatory Visit (INDEPENDENT_AMBULATORY_CARE_PROVIDER_SITE_OTHER): Payer: Medicare Other | Admitting: Internal Medicine

## 2016-10-01 ENCOUNTER — Encounter (HOSPITAL_COMMUNITY): Payer: Self-pay | Admitting: Internal Medicine

## 2016-10-02 ENCOUNTER — Encounter (INDEPENDENT_AMBULATORY_CARE_PROVIDER_SITE_OTHER): Payer: Self-pay

## 2016-11-27 DIAGNOSIS — L570 Actinic keratosis: Secondary | ICD-10-CM | POA: Diagnosis not present

## 2016-11-27 DIAGNOSIS — Z85828 Personal history of other malignant neoplasm of skin: Secondary | ICD-10-CM | POA: Diagnosis not present

## 2016-11-27 DIAGNOSIS — L821 Other seborrheic keratosis: Secondary | ICD-10-CM | POA: Diagnosis not present

## 2017-01-16 DIAGNOSIS — R945 Abnormal results of liver function studies: Secondary | ICD-10-CM | POA: Diagnosis not present

## 2017-01-16 DIAGNOSIS — Z0001 Encounter for general adult medical examination with abnormal findings: Secondary | ICD-10-CM | POA: Diagnosis not present

## 2017-01-16 DIAGNOSIS — R7309 Other abnormal glucose: Secondary | ICD-10-CM | POA: Diagnosis not present

## 2017-01-16 DIAGNOSIS — Z6825 Body mass index (BMI) 25.0-25.9, adult: Secondary | ICD-10-CM | POA: Diagnosis not present

## 2017-01-16 DIAGNOSIS — I10 Essential (primary) hypertension: Secondary | ICD-10-CM | POA: Diagnosis not present

## 2017-01-16 DIAGNOSIS — R972 Elevated prostate specific antigen [PSA]: Secondary | ICD-10-CM | POA: Diagnosis not present

## 2017-01-16 DIAGNOSIS — E663 Overweight: Secondary | ICD-10-CM | POA: Diagnosis not present

## 2017-01-16 DIAGNOSIS — M1000 Idiopathic gout, unspecified site: Secondary | ICD-10-CM | POA: Diagnosis not present

## 2017-01-16 DIAGNOSIS — Z1389 Encounter for screening for other disorder: Secondary | ICD-10-CM | POA: Diagnosis not present

## 2017-01-22 DIAGNOSIS — Z1211 Encounter for screening for malignant neoplasm of colon: Secondary | ICD-10-CM | POA: Diagnosis not present

## 2017-03-08 ENCOUNTER — Other Ambulatory Visit (INDEPENDENT_AMBULATORY_CARE_PROVIDER_SITE_OTHER): Payer: Self-pay | Admitting: Internal Medicine

## 2017-05-20 DIAGNOSIS — Z23 Encounter for immunization: Secondary | ICD-10-CM | POA: Diagnosis not present

## 2017-09-02 DIAGNOSIS — Z1389 Encounter for screening for other disorder: Secondary | ICD-10-CM | POA: Diagnosis not present

## 2017-09-02 DIAGNOSIS — E663 Overweight: Secondary | ICD-10-CM | POA: Diagnosis not present

## 2017-09-02 DIAGNOSIS — Z6826 Body mass index (BMI) 26.0-26.9, adult: Secondary | ICD-10-CM | POA: Diagnosis not present

## 2017-09-02 DIAGNOSIS — H6692 Otitis media, unspecified, left ear: Secondary | ICD-10-CM | POA: Diagnosis not present

## 2017-09-16 ENCOUNTER — Other Ambulatory Visit (INDEPENDENT_AMBULATORY_CARE_PROVIDER_SITE_OTHER): Payer: Self-pay | Admitting: Internal Medicine

## 2017-09-16 NOTE — Telephone Encounter (Signed)
Patient will need office prior to further refills , he has a 3 months supply. Or he may get from his PCP.

## 2017-09-16 NOTE — Telephone Encounter (Signed)
I called patient and left a voicemail

## 2017-09-17 DIAGNOSIS — G4709 Other insomnia: Secondary | ICD-10-CM | POA: Diagnosis not present

## 2017-09-17 DIAGNOSIS — F419 Anxiety disorder, unspecified: Secondary | ICD-10-CM | POA: Diagnosis not present

## 2017-09-17 DIAGNOSIS — E663 Overweight: Secondary | ICD-10-CM | POA: Diagnosis not present

## 2017-09-17 DIAGNOSIS — Z6825 Body mass index (BMI) 25.0-25.9, adult: Secondary | ICD-10-CM | POA: Diagnosis not present

## 2017-10-07 ENCOUNTER — Emergency Department (HOSPITAL_COMMUNITY)
Admission: EM | Admit: 2017-10-07 | Discharge: 2017-10-07 | Disposition: A | Discharge status: Home / Self Care | Payer: Medicare Other | Attending: Emergency Medicine | Admitting: Emergency Medicine

## 2017-10-07 ENCOUNTER — Encounter (HOSPITAL_COMMUNITY): Payer: Self-pay

## 2017-10-07 DIAGNOSIS — R197 Diarrhea, unspecified: Secondary | ICD-10-CM | POA: Diagnosis not present

## 2017-10-07 DIAGNOSIS — I10 Essential (primary) hypertension: Secondary | ICD-10-CM | POA: Diagnosis not present

## 2017-10-07 DIAGNOSIS — Z79899 Other long term (current) drug therapy: Secondary | ICD-10-CM | POA: Diagnosis not present

## 2017-10-07 DIAGNOSIS — R109 Unspecified abdominal pain: Secondary | ICD-10-CM | POA: Diagnosis not present

## 2017-10-07 DIAGNOSIS — Z7982 Long term (current) use of aspirin: Secondary | ICD-10-CM | POA: Diagnosis not present

## 2017-10-07 LAB — URINALYSIS, ROUTINE W REFLEX MICROSCOPIC
Bilirubin Urine: NEGATIVE
Glucose, UA: NEGATIVE mg/dL
Hgb urine dipstick: NEGATIVE
Ketones, ur: 5 mg/dL — AB
Leukocytes, UA: NEGATIVE
Nitrite: NEGATIVE
Protein, ur: NEGATIVE mg/dL
Specific Gravity, Urine: 1.026 (ref 1.005–1.030)
pH: 5 (ref 5.0–8.0)

## 2017-10-07 LAB — COMPREHENSIVE METABOLIC PANEL
ALT: 29 U/L (ref 17–63)
AST: 27 U/L (ref 15–41)
Albumin: 4.2 g/dL (ref 3.5–5.0)
Alkaline Phosphatase: 89 U/L (ref 38–126)
Anion gap: 14 (ref 5–15)
BUN: 19 mg/dL (ref 6–20)
CO2: 22 mmol/L (ref 22–32)
Calcium: 9.3 mg/dL (ref 8.9–10.3)
Chloride: 102 mmol/L (ref 101–111)
Creatinine, Ser: 1.06 mg/dL (ref 0.61–1.24)
GFR calc Af Amer: >60 mL/min (ref >60)
GFR calc non Af Amer: >60 mL/min (ref >60)
Glucose, Bld: 117 mg/dL — ABNORMAL HIGH (ref 65–99)
Potassium: 3.9 mmol/L (ref 3.5–5.1)
Sodium: 138 mmol/L (ref 135–145)
Total Bilirubin: 1.4 mg/dL — ABNORMAL HIGH (ref 0.3–1.2)
Total Protein: 7.3 g/dL (ref 6.5–8.1)

## 2017-10-07 LAB — CBC
HCT: 53.8 % — ABNORMAL HIGH (ref 39.0–52.0)
Hemoglobin: 17.8 g/dL — ABNORMAL HIGH (ref 13.0–17.0)
MCH: 30.2 pg (ref 26.0–34.0)
MCHC: 33.1 g/dL (ref 30.0–36.0)
MCV: 91.3 fL (ref 78.0–100.0)
Platelets: 251 10*3/uL (ref 150–400)
RBC: 5.89 MIL/uL — ABNORMAL HIGH (ref 4.22–5.81)
RDW: 14.9 % (ref 11.5–15.5)
WBC: 18.1 10*3/uL — ABNORMAL HIGH (ref 4.0–10.5)

## 2017-10-07 LAB — C DIFFICILE QUICK SCREEN W PCR REFLEX
C Diff antigen: NEGATIVE
C Diff toxin: POSITIVE — AB

## 2017-10-07 LAB — LIPASE, BLOOD: Lipase: 30 U/L (ref 11–51)

## 2017-10-07 LAB — CLOSTRIDIUM DIFFICILE BY PCR, REFLEXED: Toxigenic C. Difficile by PCR: POSITIVE — AB

## 2017-10-07 MED ORDER — PROMETHAZINE HCL 25 MG/ML IJ SOLN
12.5000 mg | Freq: Once | INTRAMUSCULAR | Status: AC
  Administered 2017-10-07: 12.5 mg via INTRAVENOUS
  Filled 2017-10-07: qty 1

## 2017-10-07 MED ORDER — SODIUM CHLORIDE 0.9 % IV BOLUS (SEPSIS)
1000.0000 mL | Freq: Once | INTRAVENOUS | Status: AC
  Administered 2017-10-07: 1000 mL via INTRAVENOUS

## 2017-10-07 MED ORDER — LOPERAMIDE HCL 2 MG PO CAPS
4.0000 mg | ORAL_CAPSULE | Freq: Once | ORAL | Status: AC
  Administered 2017-10-07: 4 mg via ORAL
  Filled 2017-10-07: qty 2

## 2017-10-07 NOTE — ED Triage Notes (Signed)
Patient complains of Right sided abdominal pain with diarrhea intermittently x 2 weeks. Seen 2 years ago per patient and diagnosed with gall bladder disease. vomited x 2 this am,

## 2017-10-07 NOTE — ED Notes (Signed)
Pt ambulated to the bathroom.  

## 2017-10-08 ENCOUNTER — Ambulatory Visit (INDEPENDENT_AMBULATORY_CARE_PROVIDER_SITE_OTHER): Payer: Medicare Other | Admitting: Internal Medicine

## 2017-10-08 LAB — GASTROINTESTINAL PANEL BY PCR, STOOL (REPLACES STOOL CULTURE)

## 2017-10-08 NOTE — ED Provider Notes (Signed)
[facility]ENC[facility] DEPARTMENT Provider Note   CSN: [id number] Arrival date & time: 10/07/17  0857     History   Chief Complaint Chief Complaint  Patient presents with  . Diarrhea    HPI Ethan Gardner. is a 72 y.o. male.  HPI   72 year old male with diarrhea.  Onset about 2 weeks ago.  He describes multiple watery stools per day.  Decreased appetite.  Occasional crampy abdominal pain.  No blood in stool.  No fevers or chills.  No sick contacts.  Denies any significant travel history, water usage or recent antibiotic use.  Past Medical History:  Diagnosis Date  . Dysphagia   . GERD (gastroesophageal reflux disease)   . High cholesterol   . Hypertension     Patient Active Problem List   Diagnosis Date Noted  . High cholesterol 09/03/2016  . Essential hypertension, benign 09/03/2016  . Dysphagia 09/03/2016  . Esophageal dysphagia 09/03/2016    Past Surgical History:  Procedure Laterality Date  . ESOPHAGEAL DILATION N/A 09/07/2016   Procedure: ESOPHAGEAL DILATION;  Surgeon: Rogene Houston, MD;  Location: [facility];  Service: Endoscopy;  Laterality: N/A;  . ESOPHAGOGASTRODUODENOSCOPY     2001 or 2002 for dysphagia  . ESOPHAGOGASTRODUODENOSCOPY N/A 09/07/2016   Procedure: ESOPHAGOGASTRODUODENOSCOPY (EGD);  Surgeon: Rogene Houston, MD;  Location: [facility];  Service: Endoscopy;  Laterality: N/A;  11:15  . HERNIA REPAIR         Home Medications    Prior to Admission medications   Medication Sig Start Date End Date Taking? Authorizing Provider  ALPRAZolam Duanne Moron) 1 MG tablet Take 0.5 mg by mouth at bedtime as needed for sleep. 1/2 tab at night    [provider]  aspirin EC 81 MG tablet Take 1 tablet (81 mg total) by mouth daily. 09/08/16   Rogene Houston, MD  Ergocalciferol (VITAMIN D2) 2000 units TABS Take 2,000 mg by mouth daily.    [provider]  losartan-hydrochlorothiazide (HYZAAR) 50-12.5 MG tablet Take 1 tablet  by mouth daily.    [provider]  Multiple Vitamins-Minerals (CENTRUM SILVER 50+MEN PO) Take 1 tablet by mouth daily.     [provider]  Omega-3 Fatty Acids (FISH OIL) 1200 MG CAPS Take 1,200 mg by mouth daily.     [provider]  omeprazole (PRILOSEC) 20 MG capsule TAKE 1 CAPSULE BY MOUTH EVERY DAY BEFORE BREAKFAST 09/16/17   Rehman, Mechele Dawley, MD  Saw Palmetto 450 MG CAPS Take 450 mg by mouth daily.     [provider]  simvastatin (ZOCOR) 10 MG tablet Take 10 mg by mouth daily.    [provider]  tadalafil (CIALIS) 5 MG tablet Take 5 mg by mouth daily as needed for erectile dysfunction.     [provider]  tamsulosin (FLOMAX) 0.4 MG CAPS capsule Take 0.4 mg by mouth daily after supper.     [provider]    Family History No family history on file.  Social History Social History   Tobacco Use  . Smoking status: Never Smoker  . Smokeless tobacco: Never Used  Substance Use Topics  . Alcohol use: No  . Drug use: No     Allergies   Patient has no known allergies.   Review of Systems Review of Systems  All systems reviewed and negative, other than as noted in HPI.  Physical Exam Updated Vital Signs BP 108/88 (BP Location: Right Arm)  Pulse 81   Temp 97.7 F (36.5 C) (Oral)   Resp 16   Ht 5\' 10"  (1.778 m)   Wt 72.6 kg (160 lb)   SpO2 100%   BMI 22.96 kg/m   Physical Exam  Constitutional: He appears well-developed and well-nourished. No distress.  HENT:  Head: Normocephalic and atraumatic.  Eyes: Conjunctivae are normal. Right eye exhibits no discharge. Left eye exhibits no discharge.  Neck: Neck supple.  Cardiovascular: Normal rate, regular rhythm and normal heart sounds. Exam reveals no gallop and no friction rub.  No murmur heard. Pulmonary/Chest: Effort normal and breath sounds normal. No respiratory distress.  Abdominal: Soft. He exhibits no distension. There is no tenderness.    Musculoskeletal: He exhibits no edema or tenderness.  Neurological: He is alert.  Skin: Skin is warm and dry.  Psychiatric: He has a normal mood and affect. His behavior is normal. Thought content normal.  Nursing note and vitals reviewed.    ED Treatments / Results  Labs (all labs ordered are listed, but only abnormal results are displayed) Labs Reviewed  C DIFFICILE QUICK SCREEN W PCR REFLEX - Abnormal; Notable for the following components:      Result Value   C Diff toxin POSITIVE (*)    All other components within normal limits  CLOSTRIDIUM DIFFICILE BY PCR, REFLEXED - Abnormal; Notable for the following components:   Toxigenic C. Difficile by PCR POSITIVE (*)    All other components within normal limits  COMPREHENSIVE METABOLIC PANEL - Abnormal; Notable for the following components:   Glucose, Bld 117 (*)    Total Bilirubin 1.4 (*)    All other components within normal limits  CBC - Abnormal; Notable for the following components:   WBC 18.1 (*)    RBC 5.89 (*)    Hemoglobin 17.8 (*)    HCT 53.8 (*)    All other components within normal limits  URINALYSIS, ROUTINE W REFLEX MICROSCOPIC - Abnormal; Notable for the following components:   Color, Urine AMBER (*)    Ketones, ur 5 (*)    All other components within normal limits  GASTROINTESTINAL PANEL BY PCR, STOOL (REPLACES STOOL CULTURE)  LIPASE, BLOOD    EKG  EKG Interpretation None       Radiology No results found.  Procedures Procedures (including critical care time)  Medications Ordered in ED Medications  sodium chloride 0.9 % bolus 1,000 mL (0 mLs Intravenous Stopped 10/07/17 1629)  loperamide (IMODIUM) capsule 4 mg (4 mg Oral Given 10/07/17 1532)  promethazine (PHENERGAN) injection 12.5 mg (12.5 mg Intravenous Given 10/07/17 1539)     Initial Impression / Assessment and Plan / ED Course  I have reviewed the triage vital signs and the nursing notes.  Pertinent labs & imaging results that were available  during my care of the patient were reviewed by me and considered in my medical decision making (see chart for details).     72 year old male with diarrhea.  Of note chart being completed the day after visit.  C. difficile was subsequently resulted and was toxin positive.  I spoke with patient via telephone and called in a prescription for Flagyl to [facility] in [place].  Final Clinical Impressions(s) / ED Diagnoses   Final diagnoses:  Diarrhea, unspecified type    ED Discharge Orders    None       Virgel Manifold, MD 10/08/17 [id number]

## 2017-10-15 ENCOUNTER — Ambulatory Visit (INDEPENDENT_AMBULATORY_CARE_PROVIDER_SITE_OTHER): Payer: Medicare Other | Admitting: Internal Medicine

## 2017-10-15 ENCOUNTER — Encounter (INDEPENDENT_AMBULATORY_CARE_PROVIDER_SITE_OTHER): Payer: Self-pay | Admitting: Internal Medicine

## 2017-10-15 VITALS — BP 134/68 | HR 56 | Temp 97.0°F | Ht 70.0 in | Wt 167.7 lb

## 2017-10-15 DIAGNOSIS — R1319 Other dysphagia: Secondary | ICD-10-CM

## 2017-10-15 DIAGNOSIS — R1310 Dysphagia, unspecified: Secondary | ICD-10-CM | POA: Diagnosis not present

## 2017-10-15 MED ORDER — OMEPRAZOLE 20 MG PO CPDR: DELAYED_RELEASE_CAPSULE | ORAL | 11 refills | Status: DC

## 2017-10-15 NOTE — Patient Instructions (Signed)
Rx for Omeprole 20mg  sent to his pharmacy,.

## 2017-10-15 NOTE — Progress Notes (Signed)
Subjective:    Patient ID: Ethan Seat., male    DOB: April 19, 1946, 72 y.o.   MRN: [id number]  HPI Here today for f/u. Last seen in January of 2018 for dysphagia. Underwent an EGD/ED 09/07/2017 :                 - Normal esophagus.                           - Z-line regular, 39 cm from the incisors with                            focal changes of esophagitis.                           - Low-grade of narrowing Schatzki ring. Dilated.                           - 2 cm hiatal hernia.                           - Gastritis. Biopsied.                           - Normal duodenal bulb and second portion of the                            duodenum. He tells me he is doing good. No dysphagia. His appetite is good.  He has lost about 4 pounds. His BMs are normal. No melena or BRRB.    Review of Systems Past Medical History:  Diagnosis Date  . Dysphagia   . GERD (gastroesophageal reflux disease)   . High cholesterol   . Hypertension     Past Surgical History:  Procedure Laterality Date  . ESOPHAGEAL DILATION N/A 09/07/2016   Procedure: ESOPHAGEAL DILATION;  Surgeon: Rogene Houston, MD;  Location: [facility];  Service: Endoscopy;  Laterality: N/A;  . ESOPHAGOGASTRODUODENOSCOPY     2001 or 2002 for dysphagia  . ESOPHAGOGASTRODUODENOSCOPY N/A 09/07/2016   Procedure: ESOPHAGOGASTRODUODENOSCOPY (EGD);  Surgeon: Rogene Houston, MD;  Location: [facility];  Service: Endoscopy;  Laterality: N/A;  11:15  . HERNIA REPAIR      No Known Allergies  Current Outpatient Medications on File Prior to Visit  Medication Sig Dispense Refill  . ALPRAZolam (XANAX) 1 MG tablet Take 0.5 mg by mouth at bedtime as needed for sleep. 1/2 tab at night    . aspirin EC 81 MG tablet Take 1 tablet (81 mg total) by mouth daily.    Marland Kitchen losartan-hydrochlorothiazide (HYZAAR) 50-12.5 MG tablet Take 1 tablet by mouth daily.    . Multiple Vitamins-Minerals (CENTRUM SILVER 50+MEN PO) Take 1 tablet by mouth daily.     Marland Kitchen  omeprazole (PRILOSEC) 20 MG capsule TAKE 1 CAPSULE BY MOUTH EVERY DAY BEFORE BREAKFAST 30 capsule 2  . Saw Palmetto 450 MG CAPS Take 450 mg by mouth daily.     . simvastatin (ZOCOR) 10 MG tablet Take 10 mg by mouth daily.    . tadalafil (CIALIS) 5 MG tablet Take 5 mg by mouth daily as needed for erectile dysfunction.     . tamsulosin (FLOMAX) 0.4 MG  CAPS capsule Take 0.4 mg by mouth daily after supper.      No current facility-administered medications on file prior to visit.         Objective:   Physical Exam Blood pressure 134/68, pulse (!) 56, temperature (!) 97 F (36.1 C), height 5\' 10"  (1.778 m), weight 167 lb 11.2 oz (76.1 kg). Alert and oriented. Skin warm and dry. Oral mucosa is moist.   . Sclera anicteric, conjunctivae is pink. Thyroid not enlarged. No cervical lymphadenopathy. Lungs clear. Heart regular rate and rhythm.  Abdomen is soft. Bowel sounds are positive. No hepatomegaly. No abdominal masses felt. No tenderness.  No edema to lower extremities.         Assessment & Plan:  Dysphagia. He is not having any problems. Rx for Omeprazole 20mg  sent to his pharmacy.  OV in 2 yrs.

## 2017-10-17 DIAGNOSIS — R972 Elevated prostate specific antigen [PSA]: Secondary | ICD-10-CM | POA: Diagnosis not present

## 2017-10-17 DIAGNOSIS — Z6825 Body mass index (BMI) 25.0-25.9, adult: Secondary | ICD-10-CM | POA: Diagnosis not present

## 2017-10-17 DIAGNOSIS — E782 Mixed hyperlipidemia: Secondary | ICD-10-CM | POA: Diagnosis not present

## 2017-10-17 DIAGNOSIS — R7309 Other abnormal glucose: Secondary | ICD-10-CM | POA: Diagnosis not present

## 2017-10-17 DIAGNOSIS — E663 Overweight: Secondary | ICD-10-CM | POA: Diagnosis not present

## 2017-10-18 DIAGNOSIS — N529 Male erectile dysfunction, unspecified: Secondary | ICD-10-CM | POA: Diagnosis not present

## 2017-10-18 DIAGNOSIS — R972 Elevated prostate specific antigen [PSA]: Secondary | ICD-10-CM | POA: Diagnosis not present

## 2017-10-18 DIAGNOSIS — R7309 Other abnormal glucose: Secondary | ICD-10-CM | POA: Diagnosis not present

## 2017-10-18 DIAGNOSIS — E782 Mixed hyperlipidemia: Secondary | ICD-10-CM | POA: Diagnosis not present

## 2017-10-22 DIAGNOSIS — R338 Other retention of urine: Secondary | ICD-10-CM | POA: Diagnosis not present

## 2017-10-22 DIAGNOSIS — R3912 Poor urinary stream: Secondary | ICD-10-CM | POA: Diagnosis not present

## 2017-10-24 DIAGNOSIS — R338 Other retention of urine: Secondary | ICD-10-CM | POA: Diagnosis not present

## 2017-10-25 DIAGNOSIS — R338 Other retention of urine: Secondary | ICD-10-CM | POA: Diagnosis not present

## 2017-10-29 DIAGNOSIS — R338 Other retention of urine: Secondary | ICD-10-CM | POA: Diagnosis not present

## 2017-11-04 DIAGNOSIS — R338 Other retention of urine: Secondary | ICD-10-CM | POA: Diagnosis not present

## 2017-11-04 DIAGNOSIS — N401 Enlarged prostate with lower urinary tract symptoms: Secondary | ICD-10-CM | POA: Diagnosis not present

## 2017-11-04 DIAGNOSIS — N1371 Vesicoureteral-reflux without reflux nephropathy: Secondary | ICD-10-CM | POA: Diagnosis not present

## 2017-11-06 ENCOUNTER — Other Ambulatory Visit: Payer: Self-pay | Admitting: Urology

## 2017-11-11 DIAGNOSIS — N1371 Vesicoureteral-reflux without reflux nephropathy: Secondary | ICD-10-CM | POA: Diagnosis not present

## 2017-11-12 NOTE — Patient Instructions (Addendum)
[name].  11/12/2017   Your procedure is scheduled on: Monday 11/18/2017  Report to [facility] Main  Entrance             Report to admitting at  [place] AM   Call this number if you have problems the morning of surgery [phone]    Remember: Do not eat food or drink liquids :After Midnight.     Take these medicines the morning of surgery with A SIP OF WATER: Omeprazole (Prilosec)                                You may not have any metal on your body including hair pins and              piercings  Do not wear jewelry, make-up, lotions, powders or perfumes, deodorant             Do not wear nail polish.  Do not shave  48 hours prior to surgery.              Men may shave face and neck.   Do not bring valuables to the hospital. [place].  Contacts, dentures or bridgework may not be worn into surgery.  Leave suitcase in the car. After surgery it may be brought to your room.     Patients discharged the day of surgery will not be allowed to drive home.  Name and phone number of your driver:spouse- Vaughan Basta                Please read over the following fact sheets you were given: _____________________________________________________________________             [facility] - Preparing for Surgery Before surgery, you can play an important role.  Because skin is not sterile, your skin needs to be as free of germs as possible.  You can reduce the number of germs on your skin by washing with CHG (chlorahexidine gluconate) soap before surgery.  CHG is an antiseptic cleaner which kills germs and bonds with the skin to continue killing germs even after washing. Please DO NOT use if you have an allergy to CHG or antibacterial soaps.  If your skin becomes reddened/irritated stop using the CHG and inform your nurse when you arrive at Short Stay. Do not shave (including legs and underarms) for at least 48 hours  prior to the first CHG shower.  You may shave your face/neck. Please follow these instructions carefully:  1.  Shower with CHG Soap the night before surgery and the  morning of Surgery.  2.  If you choose to wash your hair, wash your hair first as usual with your  normal  shampoo.  3.  After you shampoo, rinse your hair and body thoroughly to remove the  shampoo.                           4.  Use CHG as you would any other liquid soap.  You can apply chg directly  to the skin and wash  Gently with a scrungie or clean washcloth.  5.  Apply the CHG Soap to your body ONLY FROM THE NECK DOWN.   Do not use on face/ open                           Wound or open sores. Avoid contact with eyes, ears mouth and genitals (private parts).                       Wash face,  Genitals (private parts) with your normal soap.             6.  Wash thoroughly, paying special attention to the area where your surgery  will be performed.  7.  Thoroughly rinse your body with warm water from the neck down.  8.  DO NOT shower/wash with your normal soap after using and rinsing off  the CHG Soap.                9.  Pat yourself dry with a clean towel.            10.  Wear clean pajamas.            11.  Place clean sheets on your bed the night of your first shower and do not  sleep with pets. Day of Surgery : Do not apply any lotions/deodorants the morning of surgery.  Please wear clean clothes to the hospital/surgery center.  FAILURE TO FOLLOW THESE INSTRUCTIONS MAY RESULT IN THE CANCELLATION OF YOUR SURGERY PATIENT SIGNATURE_________________________________  NURSE SIGNATURE__________________________________  ________________________________________________________________________

## 2017-11-13 ENCOUNTER — Other Ambulatory Visit: Payer: Self-pay

## 2017-11-13 ENCOUNTER — Encounter (HOSPITAL_COMMUNITY): Payer: Self-pay

## 2017-11-13 ENCOUNTER — Encounter (HOSPITAL_COMMUNITY)
Admission: RE | Admit: 2017-11-13 | Discharge: 2017-11-13 | Disposition: A | Discharge status: Home / Self Care | Payer: Medicare Other | Source: Ambulatory Visit | Attending: Urology | Admitting: Urology

## 2017-11-13 DIAGNOSIS — N401 Enlarged prostate with lower urinary tract symptoms: Secondary | ICD-10-CM | POA: Insufficient documentation

## 2017-11-13 DIAGNOSIS — I10 Essential (primary) hypertension: Secondary | ICD-10-CM | POA: Diagnosis not present

## 2017-11-13 DIAGNOSIS — Z01810 Encounter for preprocedural cardiovascular examination: Secondary | ICD-10-CM | POA: Insufficient documentation

## 2017-11-13 DIAGNOSIS — R9431 Abnormal electrocardiogram [ECG] [EKG]: Secondary | ICD-10-CM | POA: Diagnosis not present

## 2017-11-13 DIAGNOSIS — Z01812 Encounter for preprocedural laboratory examination: Secondary | ICD-10-CM | POA: Insufficient documentation

## 2017-11-13 LAB — BASIC METABOLIC PANEL
Anion gap: 10 (ref 5–15)
BUN: 17 mg/dL (ref 6–20)
CO2: 27 mmol/L (ref 22–32)
CREATININE: 0.91 mg/dL (ref 0.61–1.24)
Calcium: 9.4 mg/dL (ref 8.9–10.3)
Chloride: 106 mmol/L (ref 101–111)
GFR calc Af Amer: >60 mL/min (ref >60)
Glucose, Bld: 126 mg/dL — ABNORMAL HIGH (ref 65–99)
POTASSIUM: 4 mmol/L (ref 3.5–5.1)
SODIUM: 143 mmol/L (ref 135–145)

## 2017-11-13 LAB — CBC
HCT: 47.5 % (ref 39.0–52.0)
Hemoglobin: 15.5 g/dL (ref 13.0–17.0)
MCH: 29.8 pg (ref 26.0–34.0)
MCHC: 32.6 g/dL (ref 30.0–36.0)
MCV: 91.2 fL (ref 78.0–100.0)
PLATELETS: 346 10*3/uL (ref 150–400)
RBC: 5.21 MIL/uL (ref 4.22–5.81)
RDW: 14.4 % (ref 11.5–15.5)
WBC: 7.7 10*3/uL (ref 4.0–10.5)

## 2017-11-17 NOTE — H&P (Signed)
HPI: Ethan Gardner is a 72 year-old male with BPH and urinary retention.  His problem was discovered 10/22/2017. He currently has an indwelling catheter. His urinary retention is being treated with foley catheter and flomax.     CC: BPH  HPI: Patient is currently treated with Tamsulosin for his symptoms.   He complains of other associated symptom(s). The patient states if he were to spend the rest of his life with his current urinary condition, he would be terrible. His urinary symptoms have been worse since his last office visit.   I had him on tamsulosin 4 mg and Cialis 5 mg that he was taking every other day but his insurance would no longer cover the Cialis so he is not been taking at but remains on tamsulosin and is really not having any significant voiding symptoms at this time. He had a recent DRE with no abnormality noted and his PSA is actually decreased from last year. It is currently 2.7.     ALLERGIES: None   MEDICATIONS: Simvastatin 10 mg tablet 1/2 tablet PO Daily  Tamsulosin Hcl 0.4 mg capsule TAKE 1 CAPSULE BY MOUTH TWICE DAILY  Aspirin 81 MG TABS 1 tablet PO Q PM  Centrum Silver TABS 1 tablet PO Daily  Cialis 5 mg tablet 1/2 tablet PO Every Other Day  Fish Oil CAPS 1 tablet PO Daily  Losartan-Hydrochlorothiazide 50 mg-12.5 mg tablet 1 tablet PO Daily  Saw Palmetto 1 tablet PO Daily  Tums 200 mg calcium (500 mg) tablet,chewable Oral  Vitamin D2 2,000 unit tablet 1 tablet PO Daily     GU PSH: Complex cystometrogram, w/ void pressure and urethral pressure profile studies, any technique - 10/29/2017 Complex Uroflow - 10/29/2017 Emg surf Electrd - 10/29/2017 Inject For cystogram - 10/29/2017 Intrabd voidng Press - 10/29/2017    NON-GU PSH: Hernia Repair - 2014 Remove Tonsils - 2014    GU PMH: Urinary Retention (Worsening, Chronic), Will maintain foley at this time. Continue with Tamsulosin 0.4 mg 1 po BID and f/u in 2 days for attempted TOV. If he fails TOV will either  replace foley or begin CIC and f/u w/Dr. Karsten Ro. He may need UDS and cysto in the future - 10/22/2017 Weak Urinary Stream (Worsening, Chronic), Continue with Tamsulosin BID and instructed to avoid any OTC meds with decongestants. - 10/22/2017 BPH w/o LUTS (Stable), He is doing well on tamsulosin only. He gets a DRE and PSA done yearly by his primary care physician. I told him at this point I will remain available for any urologic issues that arise but willl see him On an as-needed basis. - 03/30/2016 BPH w/LUTS, Benign prostatic hyperplasia with urinary obstruction - 2016 Primary hypogonadism, Hypogonadism, testicular - 2015 Urinary Retention, Unspec, Urinary retention - 2014      PMH Notes: Urinary retention: He developed retention in 6/14. He was then seen by a urologist in follow-up with a voiding trial which he failed, after having the catheter in 24 hours, with a PVR of 673. He was placed on tamsulosin. He reported no significant obstructive symptoms prior to this occurrence. He eventually voided and was maintained on alpha blockade therapy.  Current treatment: Tamsulosin0.4 mg q.h.s. + Cialis 5 mg every other day     NON-GU PMH: Encounter for general adult medical examination without abnormal findings, Encounter for preventive health examination - 2016 Personal history of other diseases of the circulatory system, History of hypertension - 2014 Personal history of other diseases of the digestive system, History  of esophageal reflux - 2014    FAMILY HISTORY: Death In The Family Father - Runs In Family Death In The Family Mother - 83 In [place] is 1 daughter and 1 - Runs In [place] _4__ Living Daughter - Runs In Family No Significant Family History - Runs In Family   SOCIAL HISTORY: Marital Status: Married Preferred Language: English; Ethnicity: Not Hispanic Or Latino; Race: White Current Smoking Status: Patient has never smoked.    Tobacco Use Assessment Completed: Used Tobacco in last 30 days? Does not use smokeless tobacco. Has never drank.  Does not use drugs. Drinks 1 caffeinated drink per day. Has not had a blood transfusion.    REVIEW OF SYSTEMS:    GU Review Male:   Patient denies frequent urination, hard to postpone urination, burning/ pain with urination, get up at night to urinate, leakage of urine, stream starts and stops, trouble starting your stream, have to strain to urinate , erection problems, and penile pain.  Gastrointestinal (Upper):   Patient denies nausea, vomiting, and indigestion/ heartburn.  Gastrointestinal (Lower):   Patient denies constipation and diarrhea.  Constitutional:   Patient denies fever, night sweats, weight loss, and fatigue.  Skin:   Patient denies skin rash/ lesion and itching.  Eyes:   Patient denies blurred vision and double vision.  Ears/ Nose/ Throat:   Patient denies sore throat and sinus problems.  Hematologic/Lymphatic:   Patient denies swollen glands and easy bruising.  Cardiovascular:   Patient denies leg swelling and chest pains.  Respiratory:   Patient denies cough and shortness of breath.  Endocrine:   Patient denies excessive thirst.  Musculoskeletal:   Patient denies back pain and joint pain.  Neurological:   Patient denies headaches and dizziness.  Psychologic:   Patient denies depression and anxiety.   VITAL SIGNS:    Weight 157 lb / 71.21 kg  Height 70 in / 177.8 cm  BP 113/58 mmHg  Pulse 98 /min  BMI 22.5 kg/m   Physical Exam  Constitutional: Well nourished and well developed . No acute distress.   ENT:. The ears and nose are normal in appearance.   Neck: The appearance of the neck is normal and no neck mass is present.   Pulmonary: No respiratory distress and normal respiratory rhythm and effort.   Cardiovascular: Heart rate and rhythm are normal . No peripheral edema.   Abdomen: The abdomen is soft and nontender. No masses are palpated.  No CVA tenderness. No hernias are palpable. No hepatosplenomegaly noted.   Rectal: Rectal exam demonstrates normal sphincter tone, no tenderness and no masses. Estimated prostate size is 1+. The prostate has no nodularity and is not tender. The left seminal vesicle is nonpalpable. The right seminal vesicle is nonpalpable. The perineum is normal on inspection.   Genitourinary: Examination of the penis demonstrates an indwelling catheter, but no discharge, no masses, no lesions and a normal meatus. The penis is circumcised. The scrotum is without lesions. The right epididymis is palpably normal and non-tender. The left epididymis is palpably normal and non-tender. The right testis is non-tender and without masses. The left testis is non-tender and without masses.   Lymphatics: The femoral and inguinal nodes are not enlarged or tender.   Skin: Normal skin turgor, no visible rash and no visible skin lesions.   Neuro/Psych:. Mood and affect are appropriate.    PAST DATA REVIEWED:  Source Of History:  Patient  Records Review:  Previous Patient Records, POC Tool  Urodynamics Review:   Review Urodynamics Tests  Notes:                     Urodynamics 10/29/17: Study is performed to evaluate urinary retention.   He was found to have a maximum cystometric capacity of only 266 cc with some loss of compliance and an end filling pressure of 21 cm H2O. No instability was noted.  Pressure flow revealed 150 cc voided with a maximum flow rate of 7 cc/2nd and a detrusor pressure at maximum flow of 59 cm H2O with 116 cc PVR. His bladder was noted to be trabeculated with bilateral reflux identified.   Impression: He has a diminished functional capacity with some loss of compliance but no instability. He has a very strong detrusor contraction it was well sustained an obstructed flow pattern. He would benefit from outlet resistance surgery.   PROCEDURES:         Flexible Cystoscopy - Done on 11/04/17  Risks,  benefits, and some of the potential complications of the procedure were discussed at length with the patient including infection, bleeding, voiding discomfort, urinary retention, fever, chills, sepsis, and others. All questions were answered. Informed consent was obtained. Sterile technique and 2% Lidocaine intraurethral analgesia were used.  Meatus:  Normal size. Normal location. Normal condition.  Urethra:  No strictures.  External Sphincter:  Normal.  Verumontanum:  Normal.  Prostate:  Obstructing. Moderate hyperplasia.  Bladder Neck:  Non-obstructing.  Ureteral Orifices:  Normal location. Normal size. Normal shape. Effluxed clear urine.  Bladder:  Moderate trabeculation. No tumors. Normal mucosa. No stones.      The lower urinary tract was carefully examined. The procedure was well-tolerated and without complications. Instructions were given to call the office immediately for bloody urine, difficulty urinating, urinary retention, painful or frequent urination, fever or other illness. The patient stated that he understood these instructions and would comply with them.         Catheter / SP Tube - [id number] Simple Foley Indwelling Cath Change  The patient's indwelling foley tube was carefully removed. A 16 French Foley catheter was inserted into the bladder using sterile technique. The patient was taught routine catheter care.   ASSESSMENT/PLAN:    I discussed the various forms of outlet reduction surgery with him today including Urolift, laser and TURP. We discussed the pluses and minuses of each.  I have drawn him a picture in order for him to better understand the anatomy and how the procedures are performed. We discussed TURP in detail including the risks and complications, the probability of success, the need for an overnight stay as well as the anticipated postoperative course. He understands and has elected to proceed. 1 GU:   BPH w/LUTS - N40.1 Stable - He has significant BPH seen  cystoscopically and this has resulted in his persistent urinary retention  2   Urinary Retention - R33.8 Stable - He has failed several voiding trials now despite adequate pharmacologic therapy and is therefore going to proceed with transurethral resection of his prostate.  A preop urine culture was positive for Klebsiella that was nearly pansensitive with resistant only to ampicillin.  He was prescribed Augmentin.

## 2017-11-17 NOTE — Discharge Instructions (Signed)

## 2017-11-18 ENCOUNTER — Ambulatory Visit (HOSPITAL_COMMUNITY): Payer: Medicare Other | Admitting: Certified Registered"

## 2017-11-18 ENCOUNTER — Encounter (HOSPITAL_COMMUNITY)
Admission: RE | Disposition: A | Discharge status: Home / Self Care | Payer: Self-pay | Source: Ambulatory Visit | Attending: Urology

## 2017-11-18 ENCOUNTER — Ambulatory Visit (HOSPITAL_COMMUNITY)
Admission: RE | Admit: 2017-11-18 | Discharge: 2017-11-19 | Disposition: A | Discharge status: Home / Self Care | Payer: Medicare Other | Source: Ambulatory Visit | Attending: Urology | Admitting: Urology

## 2017-11-18 ENCOUNTER — Other Ambulatory Visit: Payer: Self-pay

## 2017-11-18 ENCOUNTER — Encounter (HOSPITAL_COMMUNITY): Payer: Self-pay

## 2017-11-18 DIAGNOSIS — Z79899 Other long term (current) drug therapy: Secondary | ICD-10-CM | POA: Insufficient documentation

## 2017-11-18 DIAGNOSIS — N401 Enlarged prostate with lower urinary tract symptoms: Secondary | ICD-10-CM | POA: Insufficient documentation

## 2017-11-18 DIAGNOSIS — R1310 Dysphagia, unspecified: Secondary | ICD-10-CM | POA: Diagnosis not present

## 2017-11-18 DIAGNOSIS — R338 Other retention of urine: Secondary | ICD-10-CM | POA: Insufficient documentation

## 2017-11-18 DIAGNOSIS — R3912 Poor urinary stream: Secondary | ICD-10-CM | POA: Insufficient documentation

## 2017-11-18 DIAGNOSIS — N320 Bladder-neck obstruction: Secondary | ICD-10-CM | POA: Diagnosis not present

## 2017-11-18 DIAGNOSIS — Z7982 Long term (current) use of aspirin: Secondary | ICD-10-CM | POA: Insufficient documentation

## 2017-11-18 DIAGNOSIS — N138 Other obstructive and reflux uropathy: Secondary | ICD-10-CM | POA: Diagnosis not present

## 2017-11-18 DIAGNOSIS — I10 Essential (primary) hypertension: Secondary | ICD-10-CM | POA: Diagnosis not present

## 2017-11-18 HISTORY — PX: TRANSURETHRAL RESECTION OF PROSTATE: SHX73

## 2017-11-18 SURGERY — TURP (TRANSURETHRAL RESECTION OF PROSTATE)
Anesthesia: General

## 2017-11-18 MED ORDER — PROPOFOL 10 MG/ML IV BOLUS
INTRAVENOUS | Status: DC | PRN
  Administered 2017-11-18: 130 mg via INTRAVENOUS

## 2017-11-18 MED ORDER — TRAMADOL HCL 50 MG PO TABS: 50.0000 mg | ORAL_TABLET | Freq: Four times a day (QID) | ORAL | 0 refills | Status: AC | PRN

## 2017-11-18 MED ORDER — CIPROFLOXACIN IN D5W 400 MG/200ML IV SOLN
400.0000 mg | INTRAVENOUS | Status: AC
  Administered 2017-11-18: 400 mg via INTRAVENOUS
  Filled 2017-11-18: qty 200

## 2017-11-18 MED ORDER — SUGAMMADEX SODIUM 200 MG/2ML IV SOLN
INTRAVENOUS | Status: DC | PRN
  Administered 2017-11-18: 150 mg via INTRAVENOUS

## 2017-11-18 MED ORDER — FENTANYL CITRATE (PF) 100 MCG/2ML IJ SOLN: 25.0000 ug | INTRAMUSCULAR | Status: DC | PRN

## 2017-11-18 MED ORDER — ONDANSETRON HCL 4 MG/2ML IJ SOLN: 4.0000 mg | INTRAMUSCULAR | Status: DC | PRN

## 2017-11-18 MED ORDER — FENTANYL CITRATE (PF) 100 MCG/2ML IJ SOLN
INTRAMUSCULAR | Status: AC
  Administered 2017-11-18: 50 ug via INTRAVENOUS
  Filled 2017-11-18: qty 2

## 2017-11-18 MED ORDER — SODIUM CHLORIDE 0.9 % IR SOLN
Status: DC | PRN
  Administered 2017-11-18: 15000 mL via INTRAVESICAL

## 2017-11-18 MED ORDER — ACETAMINOPHEN 325 MG PO TABS: 650.0000 mg | ORAL_TABLET | ORAL | Status: DC | PRN

## 2017-11-18 MED ORDER — BACITRACIN-NEOMYCIN-POLYMYXIN 400-5-5000 EX OINT: 1.0000 "application " | TOPICAL_OINTMENT | Freq: Three times a day (TID) | CUTANEOUS | Status: DC | PRN

## 2017-11-18 MED ORDER — HYDROMORPHONE HCL 1 MG/ML IJ SOLN
0.5000 mg | INTRAMUSCULAR | Status: DC | PRN
  Administered 2017-11-18: 0.5 mg via INTRAVENOUS
  Administered 2017-11-19: 1 mg via INTRAVENOUS
  Filled 2017-11-18 (×2): qty 1

## 2017-11-18 MED ORDER — CIPROFLOXACIN IN D5W 400 MG/200ML IV SOLN
400.0000 mg | Freq: Two times a day (BID) | INTRAVENOUS | Status: AC
  Administered 2017-11-18 – 2017-11-19 (×2): 400 mg via INTRAVENOUS
  Filled 2017-11-18 (×2): qty 200

## 2017-11-18 MED ORDER — DEXAMETHASONE SODIUM PHOSPHATE 10 MG/ML IJ SOLN
INTRAMUSCULAR | Status: DC | PRN
  Administered 2017-11-18: 10 mg via INTRAVENOUS

## 2017-11-18 MED ORDER — LIDOCAINE 2% (20 MG/ML) 5 ML SYRINGE
INTRAMUSCULAR | Status: DC | PRN
  Administered 2017-11-18: 50 mg via INTRAVENOUS

## 2017-11-18 MED ORDER — PROPOFOL 10 MG/ML IV BOLUS
INTRAVENOUS | Status: AC
  Filled 2017-11-18: qty 20

## 2017-11-18 MED ORDER — PHENAZOPYRIDINE HCL 200 MG PO TABS: 200.0000 mg | ORAL_TABLET | Freq: Three times a day (TID) | ORAL | 0 refills | Status: AC | PRN

## 2017-11-18 MED ORDER — LIDOCAINE 2% (20 MG/ML) 5 ML SYRINGE
INTRAMUSCULAR | Status: AC
  Filled 2017-11-18: qty 5

## 2017-11-18 MED ORDER — PHENYLEPHRINE 40 MCG/ML (10ML) SYRINGE FOR IV PUSH (FOR BLOOD PRESSURE SUPPORT)
PREFILLED_SYRINGE | INTRAVENOUS | Status: DC | PRN
  Administered 2017-11-18: 80 ug via INTRAVENOUS

## 2017-11-18 MED ORDER — ROCURONIUM BROMIDE 10 MG/ML (PF) SYRINGE
PREFILLED_SYRINGE | INTRAVENOUS | Status: DC | PRN
  Administered 2017-11-18: 50 mg via INTRAVENOUS
  Administered 2017-11-18: 10 mg via INTRAVENOUS

## 2017-11-18 MED ORDER — DEXAMETHASONE SODIUM PHOSPHATE 10 MG/ML IJ SOLN
INTRAMUSCULAR | Status: AC
  Filled 2017-11-18: qty 1

## 2017-11-18 MED ORDER — OXYCODONE HCL 5 MG PO TABS: 5.0000 mg | ORAL_TABLET | Freq: Once | ORAL | Status: DC | PRN

## 2017-11-18 MED ORDER — CIPROFLOXACIN HCL 500 MG PO TABS
500.0000 mg | ORAL_TABLET | Freq: Two times a day (BID) | ORAL | 0 refills | Status: AC
Start: 2017-11-18 — End: ?

## 2017-11-18 MED ORDER — ONDANSETRON HCL 4 MG/2ML IJ SOLN: 4.0000 mg | Freq: Once | INTRAMUSCULAR | Status: DC | PRN

## 2017-11-18 MED ORDER — LACTATED RINGERS IV SOLN
INTRAVENOUS | Status: DC
  Administered 2017-11-18: 07:00:00 via INTRAVENOUS

## 2017-11-18 MED ORDER — SODIUM CHLORIDE 0.9 % IR SOLN
3000.0000 mL | Status: DC
  Administered 2017-11-18 – 2017-11-19 (×4): 3000 mL

## 2017-11-18 MED ORDER — FENTANYL CITRATE (PF) 100 MCG/2ML IJ SOLN
INTRAMUSCULAR | Status: AC
  Filled 2017-11-18: qty 2

## 2017-11-18 MED ORDER — BELLADONNA ALKALOIDS-OPIUM 16.2-60 MG RE SUPP: 1.0000 | Freq: Four times a day (QID) | RECTAL | Status: DC | PRN

## 2017-11-18 MED ORDER — ORAL CARE MOUTH RINSE: 15.0000 mL | Freq: Two times a day (BID) | OROMUCOSAL | Status: DC

## 2017-11-18 MED ORDER — SODIUM CHLORIDE 0.9 % IV SOLN
INTRAVENOUS | Status: DC
  Administered 2017-11-18 – 2017-11-19 (×3): via INTRAVENOUS

## 2017-11-18 MED ORDER — FENTANYL CITRATE (PF) 100 MCG/2ML IJ SOLN
INTRAMUSCULAR | Status: DC | PRN
  Administered 2017-11-18 (×3): 25 ug via INTRAVENOUS
  Administered 2017-11-18: 75 ug via INTRAVENOUS

## 2017-11-18 MED ORDER — OXYCODONE HCL 5 MG/5ML PO SOLN
5.0000 mg | Freq: Once | ORAL | Status: DC | PRN
  Filled 2017-11-18: qty 5

## 2017-11-18 MED ORDER — ROCURONIUM BROMIDE 10 MG/ML (PF) SYRINGE
PREFILLED_SYRINGE | INTRAVENOUS | Status: AC
  Filled 2017-11-18: qty 5

## 2017-11-18 MED ORDER — 0.9 % SODIUM CHLORIDE (POUR BTL) OPTIME
TOPICAL | Status: DC | PRN
  Administered 2017-11-18: 1000 mL

## 2017-11-18 MED ORDER — FENTANYL CITRATE (PF) 100 MCG/2ML IJ SOLN
25.0000 ug | INTRAMUSCULAR | Status: DC | PRN
  Administered 2017-11-18 (×2): 50 ug via INTRAVENOUS

## 2017-11-18 MED ORDER — HYDROCODONE-ACETAMINOPHEN 5-325 MG PO TABS: 1.0000 | ORAL_TABLET | ORAL | Status: DC | PRN

## 2017-11-18 MED ORDER — ONDANSETRON HCL 4 MG/2ML IJ SOLN
INTRAMUSCULAR | Status: DC | PRN
  Administered 2017-11-18: 4 mg via INTRAVENOUS

## 2017-11-18 MED ORDER — MIDAZOLAM HCL 2 MG/2ML IJ SOLN
INTRAMUSCULAR | Status: DC | PRN
  Administered 2017-11-18: 1 mg via INTRAVENOUS

## 2017-11-18 MED ORDER — MIDAZOLAM HCL 2 MG/2ML IJ SOLN
INTRAMUSCULAR | Status: AC
  Filled 2017-11-18: qty 2

## 2017-11-18 MED ORDER — ONDANSETRON HCL 4 MG/2ML IJ SOLN
INTRAMUSCULAR | Status: AC
  Filled 2017-11-18: qty 2

## 2017-11-18 MED ORDER — PHENYLEPHRINE 40 MCG/ML (10ML) SYRINGE FOR IV PUSH (FOR BLOOD PRESSURE SUPPORT)
PREFILLED_SYRINGE | INTRAVENOUS | Status: AC
  Filled 2017-11-18: qty 10

## 2017-11-18 SURGICAL SUPPLY — 28 items
BAG URINE DRAINAGE (UROLOGICAL SUPPLIES) ×2 IMPLANT
BAG URO CATCHER STRL LF (MISCELLANEOUS) ×2 IMPLANT
BLADE SURG 15 STRL LF DISP TIS (BLADE) IMPLANT
BLADE SURG 15 STRL SS (BLADE)
CATH FOLEY 3WAY 30CC 20FR (CATHETERS) ×2 IMPLANT
CATH FOLEY 3WAY 30CC 24FR (CATHETERS)
CATH URTH STD 24FR FL 3W 2 (CATHETERS) IMPLANT
COVER FOOTSWITCH UNIV (MISCELLANEOUS) ×2 IMPLANT
COVER SURGICAL LIGHT HANDLE (MISCELLANEOUS) IMPLANT
ELECT REM PT RETURN 15FT ADLT (MISCELLANEOUS) IMPLANT
EVACUATOR MICROVAS BLADDER (UROLOGICAL SUPPLIES) ×2 IMPLANT
GLOVE BIOGEL M 8.0 STRL (GLOVE) ×2 IMPLANT
GOWN STRL REUS W/ TWL XL LVL3 (GOWN DISPOSABLE) IMPLANT
GOWN STRL REUS W/TWL XL LVL3 (GOWN DISPOSABLE) ×2 IMPLANT
HOLDER FOLEY CATH W/STRAP (MISCELLANEOUS) ×2 IMPLANT
LOOP CUT BIPOLAR 24F LRG (ELECTROSURGICAL) ×2 IMPLANT
MANIFOLD NEPTUNE II (INSTRUMENTS) ×2 IMPLANT
NS IRRIG 1000ML POUR BTL (IV SOLUTION) IMPLANT
PACK CYSTO (CUSTOM PROCEDURE TRAY) ×2 IMPLANT
POSITIONER SURGICAL ARM (MISCELLANEOUS) ×2 IMPLANT
SET ASPIRATION TUBING (TUBING) IMPLANT
SUT ETHILON 3 0 PS 1 (SUTURE) IMPLANT
SYR 30ML LL (SYRINGE) ×2 IMPLANT
SYR CONTROL 10ML LL (SYRINGE) ×2 IMPLANT
TUBING CONNECTING 10 (TUBING) ×2 IMPLANT
TUBING UROLOGY SET (TUBING) IMPLANT
WATER STERILE IRR 500ML POUR (IV SOLUTION) ×2 IMPLANT
WIRE COONS/BENSON .038X145CM (WIRE) IMPLANT

## 2017-11-18 NOTE — Op Note (Signed)
PATIENT:  Ethan Gardner.  PRE-OPERATIVE DIAGNOSIS: BPH with outlet obstruction  POST-OPERATIVE DIAGNOSIS: Same  PROCEDURE: TURP  SURGEON:  Claybon Jabs  INDICATION: [name]. is a 72 year old male with BPHwho has developed urinary retention.  He has been treated with maximum medical management and has been unable to void spontaneously.  He was evaluated with urodynamics which revealed a strong detrusor contraction that was well sustained in an obstructive flow pattern consistent with BPH.  We discussed the options for management and he is elected to proceed with transurethral resection of his prostate.  Preoperative urine culture was performed and he has been on antibiotics orally up until the day of surgery.  ANESTHESIA:  General  EBL:  Minimal  DRAINS: 20 French three-way Foley catheter  SPECIMEN:  Prostate chips to pathology  After informed consent the patient was brought to the major OR and placed on the table. He was administered general anesthesia and then moved to the dorsal lithotomy position. His genitalia was sterilely prepped and draped and an official timeout was then performed.  Initially the 26 French resectoscope sheath with the visual obturator was passed into the bladder and the obturator removed. I then inserted the resectoscope element with 30 lens and  performed a systematic inspection of the bladder. I noted the bladder had 3-4+ trabeculation but was free of any tumor stones or inflammatory lesions. The ureteral orifices were noted to be of normal configuration and position. Withdrawing the scope into the prostatic urethra I noted obstructing bilobar hypertrophy with elongation of the prostatic urethra and a median lobe component.  Resection was then begun I. first resected the median lobe in the midline down to the level of the bladder neck. I then began resecting the left lobe of the prostate by resecting first from the level of the bladder neck back to  the level of the Veru at the 5:00 position and then progressed in a counterclockwise direction resecting all of the adenomatous tissue of the left lobe down to the surgical capsule. Bleeding points were cauterized as they were encountered. I then turned my attention to the right lobe of the prostate and it was resected in an identical fashion. Tissue in the area of the apex was then resected circumferentially with care being taken to maintain the resection proximal to the Veru at all times. The prostatic chips were then flushed into the bladder and the Microvasive evacuator was then used to evacuate all chips from the bladder. Reinspection of the bladder revealed the mucosa to be intact, the ureteral orifices intact as well and well away from the bladder neck and area of resection. There were no prostatic chips remaining within the bladder. The prostatic capsule was intact throughout with no perforation and there was no active bleeding noted at the end of the procedure.  The resectoscope was therefore removed and the 20 French three-way Foley catheter was then inserted and balloon was filled to 30 cc. This was placed on mild traction and the bladder was irrigated with the irrigant returning clear. The catheter was then hooked to closed system drainage and continuous irrigation and the patient was awakened and taken to recovery room in stable and satisfactory condition. He tolerated the procedure well and there were no intraoperative complications.  PLAN OF CARE: Observation overnight with anticipated discharge in the morning.  PATIENT DISPOSITION:  PACU - Hemodynamically stable.

## 2017-11-18 NOTE — Anesthesia Postprocedure Evaluation (Signed)
Anesthesia Post Note  Patient: Sherrill Mckamie.  Procedure(s) Performed: TRANSURETHRAL RESECTION OF THE PROSTATE (TURP) (N/A )     Patient location during evaluation: PACU Anesthesia Type: General Level of consciousness: awake and alert Pain management: pain level controlled Vital Signs Assessment: post-procedure vital signs reviewed and stable Respiratory status: spontaneous breathing, nonlabored ventilation, respiratory function stable and patient connected to nasal cannula oxygen Cardiovascular status: blood pressure returned to baseline and stable Postop Assessment: no apparent nausea or vomiting Anesthetic complications: no    Last Vitals:  Vitals:   11/18/17 1020 11/18/17 1337  BP: (!) 148/85 (!) 143/74  Pulse: 75 70  Resp: 13 14  Temp: (!) 36.4 C 36.7 C  SpO2: 97% 98%    Last Pain:  Vitals:   11/18/17 1337  TempSrc: Axillary  PainSc:                  Eban Weick COKER

## 2017-11-18 NOTE — Anesthesia Preprocedure Evaluation (Addendum)
Anesthesia Evaluation  Patient identified by MRN, date of birth, ID band Patient awake    Reviewed: Allergy & Precautions, NPO status , Patient's Chart, lab work & pertinent test results  Airway Mallampati: II  TM Distance: >3 FB Neck ROM: Full    Dental  (+) Dental Advisory Given, Teeth Intact   Pulmonary    breath sounds clear to auscultation       Cardiovascular hypertension,  Rhythm:Regular Rate:Normal     Neuro/Psych    GI/Hepatic   Endo/Other    Renal/GU      Musculoskeletal   Abdominal   Peds  Hematology   Anesthesia Other Findings   Reproductive/Obstetrics                            Anesthesia Physical Anesthesia Plan  ASA: III  Anesthesia Plan: General   Post-op Pain Management:    Induction:   PONV Risk Score and Plan: Ondansetron and Dexamethasone  Airway Management Planned: Oral ETT  Additional Equipment:   Intra-op Plan:   Post-operative Plan: Extubation in OR  Informed Consent: I have reviewed the patients History and Physical, chart, labs and discussed the procedure including the risks, benefits and alternatives for the proposed anesthesia with the patient or authorized representative who has indicated his/her understanding and acceptance.   Dental advisory given  Plan Discussed with: CRNA and Anesthesiologist  Anesthesia Plan Comments:         Anesthesia Quick Evaluation

## 2017-11-18 NOTE — Progress Notes (Signed)
Patient ID: Ethan Gardner., adult   DOB: 01-05-1946, 72 y.o.   MRN: [id number] Patient is noted to be alert, awake and in no distress.  He is tolerating his catheter.  He has no suprapubic discomfort.    A VSS Abdomen soft and nontender.   Foley catheter draining slightly pink urine on slow drip CBI.   He is doing well status post TURP.  1.  Continue CBI overnight.   2.  Anticipate discharge in a.m.

## 2017-11-18 NOTE — Anesthesia Procedure Notes (Signed)
Procedure Name: Intubation Date/Time: 11/18/2017 8:15 AM Performed by: Pilar Grammes, CRNA Pre-anesthesia Checklist: Patient identified, Emergency Drugs available, Suction available, Patient being monitored and Timeout performed Patient Re-evaluated:Patient Re-evaluated prior to induction Oxygen Delivery Method: Circle system utilized Preoxygenation: Pre-oxygenation with 100% oxygen Induction Type: IV induction Ventilation: Mask ventilation without difficulty Laryngoscope Size: Miller, 3 and 2 Grade View: Grade I Tube type: Oral Tube size: 7.5 mm Number of attempts: 1 Airway Equipment and Method: Stylet Placement Confirmation: positive ETCO2,  ETT inserted through vocal cords under direct vision,  CO2 detector and breath sounds checked- equal and bilateral Secured at: 23 cm Tube secured with: Tape Dental Injury: Teeth and Oropharynx as per pre-operative assessment

## 2017-11-18 NOTE — Transfer of Care (Signed)
Immediate Anesthesia Transfer of Care Note  Patient: Ethan Gardner.  Procedure(s) Performed: TRANSURETHRAL RESECTION OF THE PROSTATE (TURP) (N/A )  Patient Location: PACU  Anesthesia Type:General  Level of Consciousness: alert  and patient cooperative  Airway & Oxygen Therapy: Patient connected to face mask oxygen  Post-op Assessment: Report given to RN, Post -op Vital signs reviewed and stable and Patient moving all extremities X 4  Post vital signs: stable  Last Vitals:  Vitals Value Taken Time  BP    Temp    Pulse 74 11/18/2017  9:27 AM  Resp 17 11/18/2017  9:27 AM  SpO2 100 % 11/18/2017  9:27 AM  Vitals shown include unvalidated device data.  Last Pain:  Vitals:   11/18/17 0703  TempSrc:   PainSc: 0-No pain         Complications: No apparent anesthesia complications

## 2017-11-19 ENCOUNTER — Other Ambulatory Visit: Payer: Self-pay

## 2017-11-19 DIAGNOSIS — Z79899 Other long term (current) drug therapy: Secondary | ICD-10-CM | POA: Diagnosis not present

## 2017-11-19 DIAGNOSIS — N138 Other obstructive and reflux uropathy: Secondary | ICD-10-CM | POA: Diagnosis not present

## 2017-11-19 DIAGNOSIS — N401 Enlarged prostate with lower urinary tract symptoms: Secondary | ICD-10-CM | POA: Diagnosis not present

## 2017-11-19 DIAGNOSIS — R338 Other retention of urine: Secondary | ICD-10-CM | POA: Diagnosis not present

## 2017-11-19 DIAGNOSIS — Z7982 Long term (current) use of aspirin: Secondary | ICD-10-CM | POA: Diagnosis not present

## 2017-11-19 DIAGNOSIS — R3912 Poor urinary stream: Secondary | ICD-10-CM | POA: Diagnosis not present

## 2017-11-19 MED ORDER — LABETALOL HCL 5 MG/ML IV SOLN
INTRAVENOUS | Status: AC
  Filled 2017-11-19: qty 4

## 2017-11-19 NOTE — Discharge Summary (Signed)
  Physician Discharge Summary      Patient ID: Ethan Gardner. MRN: [id number] DOB/AGE: 12-02-1945 72 y.o.  Admit date: 11/18/2017 Discharge date: 11/19/2017  Admission Diagnoses: BENIGN PROSTATIC HYPERPLASIA WITH OUTLET OBSTRUCTION  Discharge Diagnoses:  Active Problems:   BPH with urinary obstruction   Discharged Condition: good  Hospital Course: He has a history of BPH with outlet obstruction and has developed urinary retention.  Despite maximum medical management he was unable to void spontaneously.  Urodynamics revealed an adequate detrusor contraction and therefore he elected to proceed with outlet reduction surgery with transurethral resection of his prostate.  This was undertaken and proceeded without difficulty or complication.  Postoperatively he was noted to be doing well with slightly pink urine on continuous bladder irrigation.  It remained slightly pink with no clots and he had no pain or other complaints overnight.  He is doing well now his catheter has been removed and he is awaiting spontaneous voiding.  He is tolerating regular diet and is felt ready for discharge once he urinates.  He will follow-up with me as an outpatient and be maintained on Cipro for 3 days postoperatively.  Significant Diagnostic Studies: No results found.  Discharge Exam: Blood pressure 126/73, pulse 66, temperature 97.8 F (36.6 C), temperature source Oral, resp. rate 14, height 5\' 10"  (1.778 m), weight 74.3 kg (163 lb 12.8 oz), SpO2 96 %. Alert, awake and in no apparent distress. Chest: normal respiratory effort Cardiovascular: Regular rate and rhythm Abdomen: Soft and nontender GU: Normal phallus.  Foley catheter has been removed.  Disposition:    Allergies as of 11/19/2017   No Known Allergies     Medication List    STOP taking these medications   Saw Palmetto 450 MG Caps     TAKE these medications   ALPRAZolam 1 MG tablet Commonly known as:  XANAX Take 0.5 mg by mouth at  bedtime as needed for sleep.   aspirin EC 81 MG tablet Take 81 mg by mouth at bedtime.   CENTRUM SILVER 50+MEN PO Take 1 tablet by mouth daily.   ciprofloxacin 500 MG tablet Commonly known as:  CIPRO Take 1 tablet (500 mg total) by mouth 2 (two) times daily.   fluticasone 50 MCG/ACT nasal spray Commonly known as:  FLONASE Place 1 spray into both nostrils daily as needed for allergies.   losartan-hydrochlorothiazide 50-12.5 MG tablet Commonly known as:  HYZAAR Take 1 tablet by mouth daily.   omeprazole 20 MG capsule Commonly known as:  PRILOSEC TAKE 1 CAPSULE BY MOUTH EVERY DAY BEFORE BREAKFAST   phenazopyridine 200 MG tablet Commonly known as:  PYRIDIUM Take 1 tablet (200 mg total) by mouth 3 (three) times daily as needed for pain.   simvastatin 10 MG tablet Commonly known as:  ZOCOR Take 10 mg by mouth at bedtime.   traMADol 50 MG tablet Commonly known as:  ULTRAM Take 1 tablet (50 mg total) by mouth every 6 (six) hours as needed.      Follow-up Information    [facility] On 12/03/2017.   Why:  For your appiontment at 9:30 Contact information: [place]          Signed: Claybon Jabs 11/19/2017, 6:56 AM

## 2017-12-03 DIAGNOSIS — N401 Enlarged prostate with lower urinary tract symptoms: Secondary | ICD-10-CM | POA: Diagnosis not present

## 2017-12-03 DIAGNOSIS — R338 Other retention of urine: Secondary | ICD-10-CM | POA: Diagnosis not present

## 2017-12-11 DIAGNOSIS — L821 Other seborrheic keratosis: Secondary | ICD-10-CM | POA: Diagnosis not present

## 2017-12-11 DIAGNOSIS — L570 Actinic keratosis: Secondary | ICD-10-CM | POA: Diagnosis not present

## 2017-12-13 ENCOUNTER — Other Ambulatory Visit (INDEPENDENT_AMBULATORY_CARE_PROVIDER_SITE_OTHER): Payer: Self-pay | Admitting: Internal Medicine

## 2018-01-28 DIAGNOSIS — Z6824 Body mass index (BMI) 24.0-24.9, adult: Secondary | ICD-10-CM | POA: Diagnosis not present

## 2018-01-28 DIAGNOSIS — Z1389 Encounter for screening for other disorder: Secondary | ICD-10-CM | POA: Diagnosis not present

## 2018-01-28 DIAGNOSIS — Z0000 Encounter for general adult medical examination without abnormal findings: Secondary | ICD-10-CM | POA: Diagnosis not present

## 2018-02-25 DIAGNOSIS — N401 Enlarged prostate with lower urinary tract symptoms: Secondary | ICD-10-CM | POA: Diagnosis not present

## 2018-03-04 DIAGNOSIS — R972 Elevated prostate specific antigen [PSA]: Secondary | ICD-10-CM | POA: Diagnosis not present

## 2018-03-04 DIAGNOSIS — N400 Enlarged prostate without lower urinary tract symptoms: Secondary | ICD-10-CM | POA: Diagnosis not present

## 2018-05-19 DIAGNOSIS — Z23 Encounter for immunization: Secondary | ICD-10-CM | POA: Diagnosis not present

## 2018-05-19 DIAGNOSIS — Z1389 Encounter for screening for other disorder: Secondary | ICD-10-CM | POA: Diagnosis not present

## 2018-05-19 DIAGNOSIS — E663 Overweight: Secondary | ICD-10-CM | POA: Diagnosis not present

## 2018-05-19 DIAGNOSIS — Z6825 Body mass index (BMI) 25.0-25.9, adult: Secondary | ICD-10-CM | POA: Diagnosis not present

## 2018-05-19 DIAGNOSIS — R05 Cough: Secondary | ICD-10-CM | POA: Diagnosis not present

## 2018-07-15 DIAGNOSIS — Z1211 Encounter for screening for malignant neoplasm of colon: Secondary | ICD-10-CM | POA: Diagnosis not present

## 2018-08-13 DIAGNOSIS — R590 Localized enlarged lymph nodes: Secondary | ICD-10-CM | POA: Diagnosis not present

## 2018-08-13 DIAGNOSIS — E663 Overweight: Secondary | ICD-10-CM | POA: Diagnosis not present

## 2018-08-13 DIAGNOSIS — Z6825 Body mass index (BMI) 25.0-25.9, adult: Secondary | ICD-10-CM | POA: Diagnosis not present

## 2018-08-19 DIAGNOSIS — E663 Overweight: Secondary | ICD-10-CM | POA: Diagnosis not present

## 2018-08-19 DIAGNOSIS — R590 Localized enlarged lymph nodes: Secondary | ICD-10-CM | POA: Diagnosis not present

## 2018-08-19 DIAGNOSIS — Z6825 Body mass index (BMI) 25.0-25.9, adult: Secondary | ICD-10-CM | POA: Diagnosis not present

## 2018-08-19 DIAGNOSIS — R221 Localized swelling, mass and lump, neck: Secondary | ICD-10-CM | POA: Diagnosis not present

## 2018-11-29 ENCOUNTER — Other Ambulatory Visit (INDEPENDENT_AMBULATORY_CARE_PROVIDER_SITE_OTHER): Payer: Self-pay | Admitting: Internal Medicine

## 2019-02-18 DIAGNOSIS — Z6823 Body mass index (BMI) 23.0-23.9, adult: Secondary | ICD-10-CM | POA: Diagnosis not present

## 2019-02-18 DIAGNOSIS — I10 Essential (primary) hypertension: Secondary | ICD-10-CM | POA: Diagnosis not present

## 2019-02-18 DIAGNOSIS — R972 Elevated prostate specific antigen [PSA]: Secondary | ICD-10-CM | POA: Diagnosis not present

## 2019-02-18 DIAGNOSIS — E782 Mixed hyperlipidemia: Secondary | ICD-10-CM | POA: Diagnosis not present

## 2019-02-18 DIAGNOSIS — E7849 Other hyperlipidemia: Secondary | ICD-10-CM | POA: Diagnosis not present

## 2019-02-18 DIAGNOSIS — R7309 Other abnormal glucose: Secondary | ICD-10-CM | POA: Diagnosis not present

## 2019-02-18 DIAGNOSIS — Z0001 Encounter for general adult medical examination with abnormal findings: Secondary | ICD-10-CM | POA: Diagnosis not present

## 2019-02-18 DIAGNOSIS — Z1389 Encounter for screening for other disorder: Secondary | ICD-10-CM | POA: Diagnosis not present

## 2019-05-14 DIAGNOSIS — Z23 Encounter for immunization: Secondary | ICD-10-CM | POA: Diagnosis not present

## 2019-08-18 DIAGNOSIS — Z23 Encounter for immunization: Secondary | ICD-10-CM | POA: Diagnosis not present

## 2019-09-18 DIAGNOSIS — Z23 Encounter for immunization: Secondary | ICD-10-CM | POA: Diagnosis not present

## 2019-11-19 DIAGNOSIS — L570 Actinic keratosis: Secondary | ICD-10-CM | POA: Diagnosis not present

## 2019-11-19 DIAGNOSIS — Z85828 Personal history of other malignant neoplasm of skin: Secondary | ICD-10-CM | POA: Diagnosis not present

## 2019-11-19 DIAGNOSIS — L821 Other seborrheic keratosis: Secondary | ICD-10-CM | POA: Diagnosis not present

## 2020-01-19 DIAGNOSIS — F419 Anxiety disorder, unspecified: Secondary | ICD-10-CM | POA: Diagnosis not present

## 2020-01-19 DIAGNOSIS — I10 Essential (primary) hypertension: Secondary | ICD-10-CM | POA: Diagnosis not present

## 2020-01-19 DIAGNOSIS — Z1389 Encounter for screening for other disorder: Secondary | ICD-10-CM | POA: Diagnosis not present

## 2020-01-19 DIAGNOSIS — R972 Elevated prostate specific antigen [PSA]: Secondary | ICD-10-CM | POA: Diagnosis not present

## 2020-01-19 DIAGNOSIS — E7849 Other hyperlipidemia: Secondary | ICD-10-CM | POA: Diagnosis not present

## 2020-02-10 ENCOUNTER — Other Ambulatory Visit (INDEPENDENT_AMBULATORY_CARE_PROVIDER_SITE_OTHER): Payer: Self-pay | Admitting: Internal Medicine

## 2020-04-28 DIAGNOSIS — L0292 Furuncle, unspecified: Secondary | ICD-10-CM | POA: Diagnosis not present

## 2020-04-28 DIAGNOSIS — Z6824 Body mass index (BMI) 24.0-24.9, adult: Secondary | ICD-10-CM | POA: Diagnosis not present

## 2020-05-10 DIAGNOSIS — Z23 Encounter for immunization: Secondary | ICD-10-CM | POA: Diagnosis not present

## 2020-05-10 DIAGNOSIS — Z0001 Encounter for general adult medical examination with abnormal findings: Secondary | ICD-10-CM | POA: Diagnosis not present

## 2020-05-10 DIAGNOSIS — R7309 Other abnormal glucose: Secondary | ICD-10-CM | POA: Diagnosis not present

## 2020-05-10 DIAGNOSIS — E7849 Other hyperlipidemia: Secondary | ICD-10-CM | POA: Diagnosis not present

## 2020-05-10 DIAGNOSIS — Z1331 Encounter for screening for depression: Secondary | ICD-10-CM | POA: Diagnosis not present

## 2020-05-10 DIAGNOSIS — Z6824 Body mass index (BMI) 24.0-24.9, adult: Secondary | ICD-10-CM | POA: Diagnosis not present

## 2020-05-10 DIAGNOSIS — R972 Elevated prostate specific antigen [PSA]: Secondary | ICD-10-CM | POA: Diagnosis not present

## 2020-06-04 ENCOUNTER — Other Ambulatory Visit: Payer: Medicare Other

## 2020-06-04 ENCOUNTER — Other Ambulatory Visit: Payer: Self-pay

## 2020-06-04 DIAGNOSIS — Z20822 Contact with and (suspected) exposure to covid-19: Secondary | ICD-10-CM

## 2020-06-05 LAB — NOVEL CORONAVIRUS, NAA: SARS-CoV-2, NAA: NOT DETECTED

## 2020-06-05 LAB — SARS-COV-2, NAA 2 DAY TAT

## 2020-06-07 DIAGNOSIS — Z23 Encounter for immunization: Secondary | ICD-10-CM | POA: Diagnosis not present

## 2020-06-10 DIAGNOSIS — Z1211 Encounter for screening for malignant neoplasm of colon: Secondary | ICD-10-CM | POA: Diagnosis not present

## 2020-08-03 DIAGNOSIS — R350 Frequency of micturition: Secondary | ICD-10-CM | POA: Diagnosis not present

## 2020-08-03 DIAGNOSIS — R972 Elevated prostate specific antigen [PSA]: Secondary | ICD-10-CM | POA: Diagnosis not present

## 2020-08-03 DIAGNOSIS — N401 Enlarged prostate with lower urinary tract symptoms: Secondary | ICD-10-CM | POA: Diagnosis not present

## 2020-10-05 DIAGNOSIS — F419 Anxiety disorder, unspecified: Secondary | ICD-10-CM | POA: Diagnosis not present

## 2020-10-05 DIAGNOSIS — Z6824 Body mass index (BMI) 24.0-24.9, adult: Secondary | ICD-10-CM | POA: Diagnosis not present

## 2020-10-05 DIAGNOSIS — I10 Essential (primary) hypertension: Secondary | ICD-10-CM | POA: Diagnosis not present

## 2020-10-05 DIAGNOSIS — N400 Enlarged prostate without lower urinary tract symptoms: Secondary | ICD-10-CM | POA: Diagnosis not present

## 2021-05-22 DIAGNOSIS — Z23 Encounter for immunization: Secondary | ICD-10-CM | POA: Diagnosis not present

## 2021-05-25 DIAGNOSIS — Z23 Encounter for immunization: Secondary | ICD-10-CM | POA: Diagnosis not present

## 2021-06-08 DIAGNOSIS — Z6822 Body mass index (BMI) 22.0-22.9, adult: Secondary | ICD-10-CM | POA: Diagnosis not present

## 2021-06-08 DIAGNOSIS — N400 Enlarged prostate without lower urinary tract symptoms: Secondary | ICD-10-CM | POA: Diagnosis not present

## 2021-06-08 DIAGNOSIS — F419 Anxiety disorder, unspecified: Secondary | ICD-10-CM | POA: Diagnosis not present

## 2021-06-08 DIAGNOSIS — E785 Hyperlipidemia, unspecified: Secondary | ICD-10-CM | POA: Diagnosis not present

## 2021-06-08 DIAGNOSIS — I10 Essential (primary) hypertension: Secondary | ICD-10-CM | POA: Diagnosis not present

## 2021-07-20 DIAGNOSIS — R972 Elevated prostate specific antigen [PSA]: Secondary | ICD-10-CM | POA: Diagnosis not present

## 2021-07-27 DIAGNOSIS — R972 Elevated prostate specific antigen [PSA]: Secondary | ICD-10-CM | POA: Diagnosis not present

## 2021-07-27 DIAGNOSIS — N401 Enlarged prostate with lower urinary tract symptoms: Secondary | ICD-10-CM | POA: Diagnosis not present

## 2021-07-27 DIAGNOSIS — R350 Frequency of micturition: Secondary | ICD-10-CM | POA: Diagnosis not present

## 2021-08-11 DIAGNOSIS — E663 Overweight: Secondary | ICD-10-CM | POA: Diagnosis not present

## 2021-08-11 DIAGNOSIS — M25562 Pain in left knee: Secondary | ICD-10-CM | POA: Diagnosis not present

## 2021-08-11 DIAGNOSIS — Z6822 Body mass index (BMI) 22.0-22.9, adult: Secondary | ICD-10-CM | POA: Diagnosis not present

## 2022-02-02 DIAGNOSIS — F419 Anxiety disorder, unspecified: Secondary | ICD-10-CM | POA: Diagnosis not present

## 2022-02-02 DIAGNOSIS — I10 Essential (primary) hypertension: Secondary | ICD-10-CM | POA: Diagnosis not present

## 2022-02-02 DIAGNOSIS — Z6824 Body mass index (BMI) 24.0-24.9, adult: Secondary | ICD-10-CM | POA: Diagnosis not present

## 2022-02-02 DIAGNOSIS — N400 Enlarged prostate without lower urinary tract symptoms: Secondary | ICD-10-CM | POA: Diagnosis not present

## 2022-06-06 DIAGNOSIS — Z23 Encounter for immunization: Secondary | ICD-10-CM | POA: Diagnosis not present

## 2022-07-11 DIAGNOSIS — Z23 Encounter for immunization: Secondary | ICD-10-CM | POA: Diagnosis not present

## 2022-07-26 DIAGNOSIS — R972 Elevated prostate specific antigen [PSA]: Secondary | ICD-10-CM | POA: Diagnosis not present

## 2022-08-02 DIAGNOSIS — R350 Frequency of micturition: Secondary | ICD-10-CM | POA: Diagnosis not present

## 2022-08-02 DIAGNOSIS — N401 Enlarged prostate with lower urinary tract symptoms: Secondary | ICD-10-CM | POA: Diagnosis not present

## 2022-08-02 DIAGNOSIS — R972 Elevated prostate specific antigen [PSA]: Secondary | ICD-10-CM | POA: Diagnosis not present

## 2022-11-21 DIAGNOSIS — Z6823 Body mass index (BMI) 23.0-23.9, adult: Secondary | ICD-10-CM | POA: Diagnosis not present

## 2022-11-21 DIAGNOSIS — F419 Anxiety disorder, unspecified: Secondary | ICD-10-CM | POA: Diagnosis not present

## 2023-04-25 DIAGNOSIS — N400 Enlarged prostate without lower urinary tract symptoms: Secondary | ICD-10-CM | POA: Diagnosis not present

## 2023-04-25 DIAGNOSIS — E785 Hyperlipidemia, unspecified: Secondary | ICD-10-CM | POA: Diagnosis not present

## 2023-04-25 DIAGNOSIS — R7309 Other abnormal glucose: Secondary | ICD-10-CM | POA: Diagnosis not present

## 2023-04-25 DIAGNOSIS — Z6824 Body mass index (BMI) 24.0-24.9, adult: Secondary | ICD-10-CM | POA: Diagnosis not present

## 2023-04-25 DIAGNOSIS — Z1331 Encounter for screening for depression: Secondary | ICD-10-CM | POA: Diagnosis not present

## 2023-04-25 DIAGNOSIS — Z0001 Encounter for general adult medical examination with abnormal findings: Secondary | ICD-10-CM | POA: Diagnosis not present

## 2023-04-25 DIAGNOSIS — I10 Essential (primary) hypertension: Secondary | ICD-10-CM | POA: Diagnosis not present

## 2023-04-25 DIAGNOSIS — F419 Anxiety disorder, unspecified: Secondary | ICD-10-CM | POA: Diagnosis not present

## 2023-06-10 DIAGNOSIS — Z23 Encounter for immunization: Secondary | ICD-10-CM | POA: Diagnosis not present

## 2023-10-02 DIAGNOSIS — F419 Anxiety disorder, unspecified: Secondary | ICD-10-CM | POA: Diagnosis not present

## 2023-10-02 DIAGNOSIS — Z6823 Body mass index (BMI) 23.0-23.9, adult: Secondary | ICD-10-CM | POA: Diagnosis not present

## 2024-01-06 DIAGNOSIS — I781 Nevus, non-neoplastic: Secondary | ICD-10-CM | POA: Diagnosis not present

## 2024-01-06 DIAGNOSIS — Z6823 Body mass index (BMI) 23.0-23.9, adult: Secondary | ICD-10-CM | POA: Diagnosis not present

## 2024-01-28 DIAGNOSIS — Z7689 Persons encountering health services in other specified circumstances: Secondary | ICD-10-CM | POA: Diagnosis not present

## 2024-01-28 DIAGNOSIS — I10 Essential (primary) hypertension: Secondary | ICD-10-CM | POA: Diagnosis not present

## 2024-01-28 DIAGNOSIS — E782 Mixed hyperlipidemia: Secondary | ICD-10-CM | POA: Diagnosis not present

## 2024-01-28 DIAGNOSIS — Z79899 Other long term (current) drug therapy: Secondary | ICD-10-CM | POA: Diagnosis not present

## 2024-02-17 DIAGNOSIS — R7301 Impaired fasting glucose: Secondary | ICD-10-CM | POA: Diagnosis not present

## 2024-02-17 DIAGNOSIS — I10 Essential (primary) hypertension: Secondary | ICD-10-CM | POA: Diagnosis not present

## 2024-02-20 DIAGNOSIS — I10 Essential (primary) hypertension: Secondary | ICD-10-CM | POA: Diagnosis not present

## 2024-02-20 DIAGNOSIS — E782 Mixed hyperlipidemia: Secondary | ICD-10-CM | POA: Diagnosis not present

## 2024-02-20 DIAGNOSIS — R7303 Prediabetes: Secondary | ICD-10-CM | POA: Diagnosis not present

## 2024-03-31 DIAGNOSIS — L438 Other lichen planus: Secondary | ICD-10-CM | POA: Diagnosis not present

## 2024-03-31 DIAGNOSIS — L814 Other melanin hyperpigmentation: Secondary | ICD-10-CM | POA: Diagnosis not present

## 2024-03-31 DIAGNOSIS — L821 Other seborrheic keratosis: Secondary | ICD-10-CM | POA: Diagnosis not present

## 2024-03-31 DIAGNOSIS — D1801 Hemangioma of skin and subcutaneous tissue: Secondary | ICD-10-CM | POA: Diagnosis not present

## 2024-03-31 DIAGNOSIS — D485 Neoplasm of uncertain behavior of skin: Secondary | ICD-10-CM | POA: Diagnosis not present

## 2024-04-14 DIAGNOSIS — M2042 Other hammer toe(s) (acquired), left foot: Secondary | ICD-10-CM | POA: Diagnosis not present

## 2024-05-04 DIAGNOSIS — M2042 Other hammer toe(s) (acquired), left foot: Secondary | ICD-10-CM | POA: Diagnosis not present

## 2024-05-04 DIAGNOSIS — M2012 Hallux valgus (acquired), left foot: Secondary | ICD-10-CM | POA: Diagnosis not present

## 2024-05-26 DIAGNOSIS — I10 Essential (primary) hypertension: Secondary | ICD-10-CM | POA: Diagnosis not present

## 2024-06-01 DIAGNOSIS — I129 Hypertensive chronic kidney disease with stage 1 through stage 4 chronic kidney disease, or unspecified chronic kidney disease: Secondary | ICD-10-CM | POA: Diagnosis not present

## 2024-06-01 DIAGNOSIS — N1831 Chronic kidney disease, stage 3a: Secondary | ICD-10-CM | POA: Diagnosis not present

## 2024-06-01 DIAGNOSIS — E782 Mixed hyperlipidemia: Secondary | ICD-10-CM | POA: Diagnosis not present

## 2024-06-01 DIAGNOSIS — R413 Other amnesia: Secondary | ICD-10-CM | POA: Diagnosis not present

## 2024-06-01 DIAGNOSIS — I10 Essential (primary) hypertension: Secondary | ICD-10-CM | POA: Diagnosis not present

## 2024-06-01 DIAGNOSIS — R7303 Prediabetes: Secondary | ICD-10-CM | POA: Diagnosis not present

## 2024-06-04 ENCOUNTER — Encounter: Payer: Self-pay | Admitting: Physician Assistant

## 2024-06-29 DIAGNOSIS — R413 Other amnesia: Secondary | ICD-10-CM | POA: Diagnosis not present

## 2024-06-29 DIAGNOSIS — Z79899 Other long term (current) drug therapy: Secondary | ICD-10-CM | POA: Diagnosis not present

## 2024-06-29 DIAGNOSIS — I10 Essential (primary) hypertension: Secondary | ICD-10-CM | POA: Diagnosis not present

## 2024-06-29 DIAGNOSIS — M2042 Other hammer toe(s) (acquired), left foot: Secondary | ICD-10-CM | POA: Diagnosis not present

## 2024-07-09 DIAGNOSIS — M2042 Other hammer toe(s) (acquired), left foot: Secondary | ICD-10-CM | POA: Diagnosis not present

## 2024-07-16 ENCOUNTER — Ambulatory Visit

## 2024-07-16 ENCOUNTER — Encounter: Payer: Self-pay | Admitting: Physician Assistant

## 2024-07-16 ENCOUNTER — Ambulatory Visit (INDEPENDENT_AMBULATORY_CARE_PROVIDER_SITE_OTHER): Admitting: Physician Assistant

## 2024-07-16 VITALS — BP 177/79 | HR 80 | Ht 70.0 in | Wt 165.0 lb

## 2024-07-16 DIAGNOSIS — R413 Other amnesia: Secondary | ICD-10-CM | POA: Diagnosis not present

## 2024-07-16 NOTE — Patient Instructions (Signed)
 It was a pleasure to see you today at our office.   Recommendations:  Neurocognitive evaluation at our office  MRI of the brain, the radiology office will call you to arrange you appointment  Continue donepezil increase to 10 mg daily. Side effects were discussed  Follow up in 3 months Recommend visiting the website :  Dementia Success Path to better understand some behaviors related to memory loss.      https://www.barrowneuro.org/resource/neuro-rehabilitation-apps-and-games/   RECOMMENDATIONS FOR ALL PATIENTS WITH MEMORY PROBLEMS: 1. Continue to exercise (Recommend 30 minutes of walking everyday, or 3 hours every week) 2. Increase social interactions - continue going to [place] and enjoy social gatherings with friends and family 3. Eat healthy, avoid fried foods and eat more fruits and vegetables 4. Maintain adequate blood pressure, blood sugar, and blood cholesterol level. Reducing the risk of stroke and cardiovascular disease also helps promoting better memory. 5. Avoid stressful situations. Live a simple life and avoid aggravations. Organize your time and prepare for the next day in anticipation. 6. Sleep well, avoid any interruptions of sleep and avoid any distractions in the bedroom that may interfere with adequate sleep quality 7. Avoid sugar, avoid sweets as there is a strong link between excessive sugar intake, diabetes, and cognitive impairment We discussed the Mediterranean diet, which has been shown to help patients reduce the risk of progressive memory disorders and reduces cardiovascular risk. This includes eating fish, eat fruits and green leafy vegetables, nuts like almonds and hazelnuts, walnuts, and also use olive oil. Avoid fast foods and fried foods as much as possible. Avoid sweets and sugar as sugar use has been linked to worsening of memory function.  There is always a concern of gradual progression of memory problems. If this is the case, then we may need to adjust  level of care according to patient needs. Support, both to the patient and caregiver, should then be put into place.      You have been referred for a neuropsychological evaluation (i.e., evaluation of memory and thinking abilities). Please bring someone with you to this appointment if possible, as it is helpful for the doctor to hear from both you and another adult who knows you well. Please bring eyeglasses and hearing aids if you wear them.    The evaluation will take approximately 3 hours and has two parts:   The first part is a clinical interview with the neuropsychologist (Dr. Richie or Dr. Gayland). During the interview, the neuropsychologist will speak with you and the individual you brought to the appointment.    The second part of the evaluation is testing with the doctor's technician Neal or Luke). During the testing, the technician will ask you to remember different types of material, solve problems, and answer some questionnaires. Your family member will not be present for this portion of the evaluation.   Please note: We must reserve several hours of the neuropsychologist's time and the psychometrician's time for your evaluation appointment. As such, there is a No-Show fee of $100. If you are unable to attend any of your appointments, please contact our office as soon as possible to reschedule.      DRIVING: Regarding driving, in patients with progressive memory problems, driving will be impaired. We advise to have someone else do the driving if trouble finding directions or if minor accidents are reported. Independent driving assessment is available to determine safety of driving.   If you are interested in the driving assessment, you can contact the following:  [employer] in [place] [phone]  [facility] [phone]  [facility] [phone]  [facility] [phone] or [phone]   FALL PRECAUTIONS: Be cautious  when walking. Scan the area for obstacles that may increase the risk of trips and falls. When getting up in the mornings, sit up at the edge of the bed for a few minutes before getting out of bed. Consider elevating the bed at the head end to avoid drop of blood pressure when getting up. Walk always in a well-lit room (use night lights in the walls). Avoid area rugs or power cords from appliances in the middle of the walkways. Use a walker or a cane if necessary and consider physical therapy for balance exercise. Get your eyesight checked regularly.  FINANCIAL OVERSIGHT: Supervision, especially oversight when making financial decisions or transactions is also recommended.  HOME SAFETY: Consider the safety of the kitchen when operating appliances like stoves, microwave oven, and blender. Consider having supervision and share cooking responsibilities until no longer able to participate in those. Accidents with firearms and other hazards in the house should be identified and addressed as well.   ABILITY TO BE LEFT ALONE: If patient is unable to contact 911 operator, consider using LifeLine, or when the need is there, arrange for someone to stay with patients. Smoking is a fire hazard, consider supervision or cessation. Risk of wandering should be assessed by caregiver and if detected at any point, supervision and safe proof recommendations should be instituted.  MEDICATION SUPERVISION: Inability to self-administer medication needs to be constantly addressed. Implement a mechanism to ensure safe administration of the medications.      Mediterranean Diet A Mediterranean diet refers to food and lifestyle choices that are based on the traditions of countries located on the [employer]. This way of eating has been shown to help prevent certain conditions and improve outcomes for people who have chronic diseases, like kidney disease and heart disease. What are tips for following this plan? Lifestyle   Cook and eat meals together with your family, when possible. Drink enough fluid to keep your urine clear or pale yellow. Be physically active every day. This includes: Aerobic exercise like running or swimming. Leisure activities like gardening, walking, or housework. Get 7-8 hours of sleep each night. If recommended by your health care provider, drink red wine in moderation. This means 1 glass a day for nonpregnant women and 2 glasses a day for men. A glass of wine equals 5 oz (150 mL). Reading food labels  Check the serving size of packaged foods. For foods such as rice and pasta, the serving size refers to the amount of cooked product, not dry. Check the total fat in packaged foods. Avoid foods that have saturated fat or trans fats. Check the ingredients list for added sugars, such as corn syrup. Shopping  At the grocery store, buy most of your food from the areas near the walls of the store. This includes: Fresh fruits and vegetables (produce). Grains, beans, nuts, and seeds. Some of these may be available in unpackaged forms or large amounts (in bulk). Fresh seafood. Poultry and eggs. Low-fat dairy products. Buy whole ingredients instead of prepackaged foods. Buy fresh fruits and vegetables in-season from local farmers markets. Buy frozen fruits and vegetables in resealable bags. If you do not have access to quality fresh seafood, buy precooked frozen shrimp or canned fish, such as tuna, salmon, or sardines. Buy small amounts of raw or cooked vegetables, salads, or  olives from the deli or salad bar at your store. Stock your pantry so you always have certain foods on hand, such as olive oil, canned tuna, canned tomatoes, rice, pasta, and beans. Cooking  Cook foods with extra-virgin olive oil instead of using butter or other vegetable oils. Have meat as a side dish, and have vegetables or grains as your main dish. This means having meat in small portions or adding small amounts of meat  to foods like pasta or stew. Use beans or vegetables instead of meat in common dishes like chili or lasagna. Experiment with different cooking methods. Try roasting or broiling vegetables instead of steaming or sauteing them. Add frozen vegetables to soups, stews, pasta, or rice. Add nuts or seeds for added healthy fat at each meal. You can add these to yogurt, salads, or vegetable dishes. Marinate fish or vegetables using olive oil, lemon juice, garlic, and fresh herbs. Meal planning  Plan to eat 1 vegetarian meal one day each week. Try to work up to 2 vegetarian meals, if possible. Eat seafood 2 or more times a week. Have healthy snacks readily available, such as: Vegetable sticks with hummus. Greek yogurt. Fruit and nut trail mix. Eat balanced meals throughout the week. This includes: Fruit: 2-3 servings a day Vegetables: 4-5 servings a day Low-fat dairy: 2 servings a day Fish, poultry, or lean meat: 1 serving a day Beans and legumes: 2 or more servings a week Nuts and seeds: 1-2 servings a day Whole grains: 6-8 servings a day Extra-virgin olive oil: 3-4 servings a day Limit red meat and sweets to only a few servings a month What are my food choices? Mediterranean diet Recommended Grains: Whole-grain pasta. Brown rice. Bulgar wheat. Polenta. Couscous. Whole-wheat bread. [place] Madeira. Vegetables: Artichokes. Beets. Broccoli. Cabbage. Carrots. Eggplant. Green beans. Chard. Kale. Spinach. Onions. Leeks. Peas. Squash. Tomatoes. Peppers. Radishes. Fruits: Apples. Apricots. Avocado. Berries. Bananas. Cherries. Dates. Figs. Grapes. Lemons. Melon. Oranges. Peaches. Plums. Pomegranate. Meats and other protein foods: Beans. Almonds. Sunflower seeds. Pine nuts. Peanuts. Cod. Salmon. Scallops. Shrimp. Tuna. Tilapia. Clams. Oysters. Eggs. Dairy: Low-fat milk. Cheese. Greek yogurt. Beverages: Water . Red wine. Herbal tea. Fats and oils: Extra virgin olive oil. Avocado oil. Grape seed  oil. Sweets and desserts: Greek yogurt with honey. Baked apples. Poached pears. Trail mix. Seasoning and other foods: Basil. Cilantro. Coriander. Cumin. Mint. Parsley. Sage. Rosemary. Tarragon. Garlic. Oregano. Thyme. Pepper. Balsalmic vinegar. Tahini. Hummus. Tomato sauce. Olives. Mushrooms. Limit these Grains: Prepackaged pasta or rice dishes. Prepackaged cereal with added sugar. Vegetables: Deep fried potatoes (french fries). Fruits: Fruit canned in syrup. Meats and other protein foods: Beef. Pork. Lamb. Poultry with skin. Hot dogs. [place]. Dairy: Ice cream. Sour cream. Whole milk. Beverages: Juice. Sugar-sweetened soft drinks. Beer. Liquor and spirits. Fats and oils: Butter. Canola oil. Vegetable oil. Beef fat (tallow). Lard. Sweets and desserts: Cookies. Cakes. Pies. Candy. Seasoning and other foods: Mayonnaise. Premade sauces and marinades. The items listed may not be a complete list. Talk with your dietitian about what dietary choices are right for you. Summary The Mediterranean diet includes both food and lifestyle choices. Eat a variety of fresh fruits and vegetables, beans, nuts, seeds, and whole grains. Limit the amount of red meat and sweets that you eat. Talk with your health care provider about whether it is safe for you to drink red wine in moderation. This means 1 glass a day for nonpregnant women and 2 glasses a day for men. A glass of wine equals 5 oz (  150 mL). This information is not intended to replace advice given to you by your health care provider. Make sure you discuss any questions you have with your health care provider. Document Released: 03/15/2016 Document Revised: 04/17/2016 Document Reviewed: 03/15/2016 Elsevier Interactive Patient Education  2017 [employer].

## 2024-07-16 NOTE — Progress Notes (Signed)
 Mild cognitive impairment likely due to Alzheimer's disease    Ethan Ligman. is a very pleasant 78 y.o. year old RH  male with a history of hypertension, hyperlipidemia, arthritis, pulmonary hypertension, prediabetes, CKD   seen today for evaluation of memory loss.  He had recent plasma biomarkers on 06/01/2024 yielding a higher probability of being clinically diagnosed with Alzheimer's disease.  MoCA today is 15/30.  Patient is on donepezil 5 mg daily by PCP since October 2025, tolerating well.Patient is able to participate on ADLs and to drive without difficulties. In view of the recent diagnosis and current MoCA score, we discussed increasing donepezil to 10 mg daily in an effort to slow down cognitive decline, patient agrees.  The patient is accompanied by his son who supplements  the history.   Recommend increasing donepezil to 10 mg daily, this is managed by PCP Neuropsych testing for clarity of diagnosis and disease trajectory   MRI brain without contrast to further investigate any structural abnormalities and vascular load Recommend good control of cardiovascular risk factors.  Patient notified of elevated BP  Continue to control mood as per PCP   Discussed the use of AI scribe software for clinical note transcription with the patient, who gave verbal consent to proceed.  History of Present Illness Ethan Gardner. is an 78 year old male with who presents with memory changes.  He was referred by her primary care physician for evaluation of memory changes and concerning lab results.  He has been experiencing memory changes over the past couple of years, primarily affecting her short-term memory. He sometimes forgets recent conversations or new information, although his long-term memory remains intact. His son has noticed that she may repeat himself, especially when discussing topics in different settings[place]  His son reports that he does  not experience disorientation at home or in her neighborhood and drives without difficulty. However, he occasionally misplaces items, such as keys, and finds them in unusual places. No hallucinations or significant personality changes have been noted, although  son reports occasional irritability. He  does not participate in many social activities in [place] but [place].  He reports sleeping well without issues of sleep apnea or frequent nightmares. Uses a pill box to manage her medications and has no trouble with adherence. He handles his  finances independently and is not susceptible to scams.  His appetite is stable, though his son notes that he snacks more than eating regular meals. No significant weight changes or swallowing difficulties. No history of headaches, chronic pain, or recent falls.  No vision changes, history of stroke, tremors, or loss of smell. He lives with his wife and spends time between [place] and [address]. He does not consume alcohol or tobacco.      Results A Beta 42/40 ratio 0.101 (below low normal), A beta 42, 20.36, A beta 40 200.74, total P tau 181 1.24 ( high), N-NFL plasma 2.51.  These results are consistent with the presence of Alzheimer's related pathology.       Allergies[1]  Current Outpatient Medications  Medication Instructions   ALPRAZolam (XANAX) 0.5 mg, At bedtime PRN   aspirin EC 81 mg, Daily at bedtime   fluticasone (FLONASE) 50 MCG/ACT nasal spray 1 spray, Daily PRN   losartan-hydrochlorothiazide (HYZAAR) 50-12.5 MG tablet 1 tablet, Daily   Multiple Vitamins-Minerals (CENTRUM SILVER 50+MEN PO) 1  tablet, Daily   omeprazole  (PRILOSEC) 20 MG capsule TAKE 1 CAPSULE BY MOUTH EVERY DAY BEFORE BREAKFAST   omeprazole  (PRILOSEC) 20 MG capsule TAKE 1 CAPSULE BY MOUTH EVERY DAY BEFORE BREAKFAST   omeprazole  (PRILOSEC) 20 MG capsule TAKE 1 CAPSULE BY MOUTH EVERY DAY WITH BREAKFAST   phenazopyridine  (PYRIDIUM ) 200 mg, Oral, 3  times daily PRN   simvastatin (ZOCOR) 10 mg, Daily at bedtime   traMADol  (ULTRAM ) 50 mg, Oral, Every 6 hours PRN     VITALS:   Vitals:   07/16/24 1318 07/16/24 1436  BP: (!) 197/79 (!) 177/79  Pulse: 80   SpO2: 96%   Weight: 165 lb (74.8 kg)   Height: 5' 10 (1.778 m)      Neurological Exam     07/16/2024    5:00 PM  Montreal Cognitive Assessment   Visuospatial/ Executive (0/5) 2  Naming (0/3) 3  Attention: Read list of digits (0/2) 2  Attention: Read list of letters (0/1) 1  Attention: Serial 7 subtraction starting at 100 (0/3) 1  Language: Repeat phrase (0/2) 1  Language : Fluency (0/1) 1  Abstraction (0/2) 0  Delayed Recall (0/5) 0  Orientation (0/6) 4  Total 15  Adjusted Score (based on education) 15        No data to display             Orientation:  Alert and oriented to person, place and not to time . No aphasia or dysarthria. Fund of knowledge is appropriate. Recent and remote memory impaired.  Attention and concentration are reduced.  Able to name objects and repeat phrases 1/2.  Delayed recall 0 /5 .  Cranial nerves: There is good facial symmetry. Extraocular muscles are intact and visual fields are full to confrontational testing. Speech is fluent and clear. No tongue deviation. Hearing is intact to conversational tone.  Tone: Tone is good throughout. Sensation: Sensation is intact to light touch.  Vibration is intact at the bilateral big toe.  Coordination: The patient has no difficulty with RAM's or FNF bilaterally. Normal finger to nose  Motor: Strength is 5/5 in the bilateral upper and lower extremities. There is no pronator drift. There are no fasciculations noted. DTR's: Deep tendon reflexes are 2/4 bilaterally. Gait and Station: The patient is able to ambulate without difficulty. Gait is cautious and narrow. Stride length is normal.        Thank you for allowing us  the opportunity to participate in the care of this nice patient. Please do not  hesitate to contact us  for any questions or concerns.   Total time spent on today's visit was 61 minutes dedicated to this patient today, preparing to see patient, examining the patient, ordering tests and/or medications and counseling the patient, documenting clinical information in the EHR or other health record, independently interpreting results and communicating results to the patient/family, discussing treatment and goals, answering patient's questions and coordinating care.  Cc:  Shona Norleen PEDLAR, MD  Camie Sevin 07/16/2024 5:18 PM       [1] No Known Allergies

## 2024-07-20 ENCOUNTER — Telehealth: Payer: Self-pay | Admitting: Physician Assistant

## 2024-07-20 NOTE — Telephone Encounter (Signed)
 Pt's daughter Katheryn called in this afternoon, and stated that she needs a letter stating Pt has All-Timer so they can take it to an [place] [employer] .  Katheryn stated that the letter should state that Pt can not make certain decisions on his own. Katheryn stated they do not know if they can make Pt come to his appointment at our office , due to Pt stated that he does not have a memory problem.  Katheryn stated that Pt is threatening   to go  [facility] and remove her brother and herself as POA. Please call. Thanks

## 2024-07-21 NOTE — Telephone Encounter (Signed)
 [facility] call ID: [id number]  Katheryn Lesches; daughter  [facility]: [phone]  Reason: Caller states her dad is confused. They need a letter from the office stating he has Alzheimer's.

## 2024-07-21 NOTE — Telephone Encounter (Signed)
 Called Daugher and she has problem getting father here. I informed her of Dr. Richie answer and she understood. I told her to call if she need to cancel.

## 2024-07-23 ENCOUNTER — Ambulatory Visit: Payer: Self-pay

## 2024-07-23 ENCOUNTER — Institutional Professional Consult (permissible substitution): Payer: Self-pay | Admitting: Psychology

## 2024-08-03 ENCOUNTER — Encounter: Payer: Self-pay | Admitting: Psychology

## 2024-08-08 ENCOUNTER — Other Ambulatory Visit

## 2024-10-14 ENCOUNTER — Ambulatory Visit: Payer: Self-pay | Admitting: Physician Assistant
